# Patient Record
Sex: Male | Born: 1960 | Race: Black or African American | Hispanic: No | Marital: Married | Smoking: Former smoker
Health system: Southern US, Community
[De-identification: ages and names within clinical notes are randomized; demographics above are authoritative.]

## PROBLEM LIST (undated history)

## (undated) DIAGNOSIS — C801 Malignant (primary) neoplasm, unspecified: Secondary | ICD-10-CM

## (undated) DIAGNOSIS — I10 Essential (primary) hypertension: Secondary | ICD-10-CM

## (undated) DIAGNOSIS — F329 Major depressive disorder, single episode, unspecified: Secondary | ICD-10-CM

## (undated) DIAGNOSIS — I2510 Atherosclerotic heart disease of native coronary artery without angina pectoris: Secondary | ICD-10-CM

## (undated) DIAGNOSIS — E785 Hyperlipidemia, unspecified: Secondary | ICD-10-CM

## (undated) DIAGNOSIS — E119 Type 2 diabetes mellitus without complications: Secondary | ICD-10-CM

## (undated) DIAGNOSIS — F32A Depression, unspecified: Secondary | ICD-10-CM

## (undated) HISTORY — DX: Atherosclerotic heart disease of native coronary artery without angina pectoris: I25.10

## (undated) HISTORY — DX: Hyperlipidemia, unspecified: E78.5

## (undated) HISTORY — DX: Essential (primary) hypertension: I10

## (undated) HISTORY — PX: CARDIAC SURGERY: SHX584

## (undated) HISTORY — PX: CORONARY ANGIOPLASTY WITH STENT PLACEMENT: SHX49

---

## 1989-06-10 DIAGNOSIS — Z87898 Personal history of other specified conditions: Secondary | ICD-10-CM

## 2006-10-29 ENCOUNTER — Ambulatory Visit: Payer: Self-pay | Admitting: Cardiology

## 2006-10-29 ENCOUNTER — Inpatient Hospital Stay (HOSPITAL_COMMUNITY): Admission: EM | Admit: 2006-10-29 | Discharge: 2006-10-30 | Payer: Self-pay | Admitting: Emergency Medicine

## 2006-10-31 ENCOUNTER — Ambulatory Visit: Payer: Self-pay

## 2006-12-17 ENCOUNTER — Ambulatory Visit: Payer: Self-pay | Admitting: Cardiology

## 2007-03-19 ENCOUNTER — Ambulatory Visit: Payer: Self-pay | Admitting: Cardiology

## 2007-06-04 ENCOUNTER — Emergency Department (HOSPITAL_COMMUNITY): Admission: EM | Admit: 2007-06-04 | Discharge: 2007-06-04 | Payer: Self-pay | Admitting: Emergency Medicine

## 2007-07-09 ENCOUNTER — Ambulatory Visit: Payer: Self-pay | Admitting: Cardiology

## 2008-03-16 ENCOUNTER — Ambulatory Visit: Payer: Self-pay | Admitting: Cardiology

## 2008-03-28 ENCOUNTER — Ambulatory Visit: Payer: Self-pay | Admitting: Cardiology

## 2008-05-17 ENCOUNTER — Ambulatory Visit: Payer: Self-pay | Admitting: Family Medicine

## 2008-05-17 DIAGNOSIS — I10 Essential (primary) hypertension: Secondary | ICD-10-CM | POA: Insufficient documentation

## 2008-05-17 DIAGNOSIS — B9789 Other viral agents as the cause of diseases classified elsewhere: Secondary | ICD-10-CM

## 2008-05-17 DIAGNOSIS — E785 Hyperlipidemia, unspecified: Secondary | ICD-10-CM

## 2008-06-22 ENCOUNTER — Ambulatory Visit: Payer: Self-pay | Admitting: *Deleted

## 2008-08-29 ENCOUNTER — Ambulatory Visit: Payer: Self-pay | Admitting: Family Medicine

## 2008-08-29 DIAGNOSIS — K089 Disorder of teeth and supporting structures, unspecified: Secondary | ICD-10-CM | POA: Insufficient documentation

## 2008-08-29 DIAGNOSIS — R5381 Other malaise: Secondary | ICD-10-CM | POA: Insufficient documentation

## 2008-08-29 DIAGNOSIS — R5383 Other fatigue: Secondary | ICD-10-CM

## 2008-08-29 DIAGNOSIS — D179 Benign lipomatous neoplasm, unspecified: Secondary | ICD-10-CM | POA: Insufficient documentation

## 2008-08-29 DIAGNOSIS — Z8639 Personal history of other endocrine, nutritional and metabolic disease: Secondary | ICD-10-CM

## 2008-08-29 DIAGNOSIS — Z862 Personal history of diseases of the blood and blood-forming organs and certain disorders involving the immune mechanism: Secondary | ICD-10-CM

## 2008-08-30 ENCOUNTER — Encounter (INDEPENDENT_AMBULATORY_CARE_PROVIDER_SITE_OTHER): Payer: Self-pay | Admitting: Family Medicine

## 2008-08-31 ENCOUNTER — Encounter: Payer: Self-pay | Admitting: Physician Assistant

## 2008-08-31 ENCOUNTER — Ambulatory Visit: Payer: Self-pay | Admitting: Internal Medicine

## 2008-08-31 DIAGNOSIS — R072 Precordial pain: Secondary | ICD-10-CM

## 2008-08-31 DIAGNOSIS — I2510 Atherosclerotic heart disease of native coronary artery without angina pectoris: Secondary | ICD-10-CM | POA: Insufficient documentation

## 2008-09-02 ENCOUNTER — Encounter (INDEPENDENT_AMBULATORY_CARE_PROVIDER_SITE_OTHER): Payer: Self-pay | Admitting: Family Medicine

## 2008-09-02 LAB — CONVERTED CEMR LAB
AST: 271 units/L — ABNORMAL HIGH (ref 0–37)
Albumin: 4.3 g/dL (ref 3.5–5.2)
Alkaline Phosphatase: 91 units/L (ref 39–117)
BUN: 19 mg/dL (ref 6–23)
Basophils Absolute: 0.1 10*3/uL (ref 0.0–0.1)
HDL: 33 mg/dL — ABNORMAL LOW (ref >39)
Lymphocytes Relative: 45 % (ref 12–46)
Neutro Abs: 2.4 10*3/uL (ref 1.7–7.7)
Neutrophils Relative %: 40 % — ABNORMAL LOW (ref 43–77)
Platelets: 339 10*3/uL (ref 150–400)
Potassium: 4.8 meq/L (ref 3.5–5.3)
RDW: 14.6 % (ref 11.5–15.5)
Sodium: 145 meq/L (ref 135–145)
TSH: 1.385 microintl units/mL (ref 0.350–4.500)
Total Protein: 7.6 g/dL (ref 6.0–8.3)

## 2008-09-09 ENCOUNTER — Ambulatory Visit (HOSPITAL_COMMUNITY): Admission: RE | Admit: 2008-09-09 | Discharge: 2008-09-09 | Payer: Self-pay | Admitting: Family Medicine

## 2008-09-12 ENCOUNTER — Telehealth (INDEPENDENT_AMBULATORY_CARE_PROVIDER_SITE_OTHER): Payer: Self-pay | Admitting: *Deleted

## 2008-09-12 ENCOUNTER — Ambulatory Visit: Payer: Self-pay | Admitting: Family Medicine

## 2008-09-14 ENCOUNTER — Telehealth (INDEPENDENT_AMBULATORY_CARE_PROVIDER_SITE_OTHER): Payer: Self-pay | Admitting: *Deleted

## 2008-09-15 ENCOUNTER — Ambulatory Visit: Payer: Self-pay

## 2008-09-15 ENCOUNTER — Encounter: Payer: Self-pay | Admitting: Cardiology

## 2008-09-16 ENCOUNTER — Encounter: Payer: Self-pay | Admitting: Cardiology

## 2008-09-16 ENCOUNTER — Ambulatory Visit: Payer: Self-pay | Admitting: Cardiology

## 2008-09-16 DIAGNOSIS — I428 Other cardiomyopathies: Secondary | ICD-10-CM

## 2008-09-16 DIAGNOSIS — Z9119 Patient's noncompliance with other medical treatment and regimen: Secondary | ICD-10-CM

## 2008-09-16 DIAGNOSIS — M109 Gout, unspecified: Secondary | ICD-10-CM

## 2008-09-17 ENCOUNTER — Encounter (INDEPENDENT_AMBULATORY_CARE_PROVIDER_SITE_OTHER): Payer: Self-pay | Admitting: Family Medicine

## 2008-09-19 ENCOUNTER — Ambulatory Visit: Payer: Self-pay | Admitting: Family Medicine

## 2008-09-19 DIAGNOSIS — M79609 Pain in unspecified limb: Secondary | ICD-10-CM

## 2008-09-23 ENCOUNTER — Ambulatory Visit (HOSPITAL_COMMUNITY): Admission: RE | Admit: 2008-09-23 | Discharge: 2008-09-23 | Payer: Self-pay | Admitting: Family Medicine

## 2008-09-23 LAB — CONVERTED CEMR LAB
ALT: 191 units/L — ABNORMAL HIGH (ref 0–53)
Albumin: 4.4 g/dL (ref 3.5–5.2)
Bilirubin, Direct: 0.1 mg/dL (ref 0.0–0.3)
Cholesterol: 275 mg/dL — ABNORMAL HIGH (ref 0–200)
HDL: 40 mg/dL (ref >39)
Hep B S Ab: NEGATIVE
Total CHOL/HDL Ratio: 6.9
Total Protein: 7.3 g/dL (ref 6.0–8.3)
VLDL: 58 mg/dL — ABNORMAL HIGH (ref 0–40)

## 2008-10-11 ENCOUNTER — Telehealth (INDEPENDENT_AMBULATORY_CARE_PROVIDER_SITE_OTHER): Payer: Self-pay | Admitting: Family Medicine

## 2008-10-11 ENCOUNTER — Encounter (INDEPENDENT_AMBULATORY_CARE_PROVIDER_SITE_OTHER): Payer: Self-pay | Admitting: Family Medicine

## 2008-10-20 ENCOUNTER — Encounter (INDEPENDENT_AMBULATORY_CARE_PROVIDER_SITE_OTHER): Payer: Self-pay | Admitting: Family Medicine

## 2008-11-14 ENCOUNTER — Ambulatory Visit: Payer: Self-pay | Admitting: Nurse Practitioner

## 2008-11-14 ENCOUNTER — Encounter (INDEPENDENT_AMBULATORY_CARE_PROVIDER_SITE_OTHER): Payer: Self-pay | Admitting: Family Medicine

## 2008-11-15 DIAGNOSIS — K7689 Other specified diseases of liver: Secondary | ICD-10-CM

## 2008-11-15 LAB — CONVERTED CEMR LAB
ALT: 135 units/L — ABNORMAL HIGH (ref 0–53)
AST: 107 units/L — ABNORMAL HIGH (ref 0–37)
Alkaline Phosphatase: 72 units/L (ref 39–117)
Cholesterol: 273 mg/dL — ABNORMAL HIGH (ref 0–200)
Indirect Bilirubin: 0.3 mg/dL (ref 0.0–0.9)
Total Protein: 7.5 g/dL (ref 6.0–8.3)
Triglycerides: 216 mg/dL — ABNORMAL HIGH (ref <150)
VLDL: 43 mg/dL — ABNORMAL HIGH (ref 0–40)

## 2008-11-18 ENCOUNTER — Encounter (INDEPENDENT_AMBULATORY_CARE_PROVIDER_SITE_OTHER): Payer: Self-pay | Admitting: *Deleted

## 2008-11-21 ENCOUNTER — Telehealth (INDEPENDENT_AMBULATORY_CARE_PROVIDER_SITE_OTHER): Payer: Self-pay | Admitting: Nurse Practitioner

## 2008-11-24 ENCOUNTER — Encounter (INDEPENDENT_AMBULATORY_CARE_PROVIDER_SITE_OTHER): Payer: Self-pay | Admitting: Nurse Practitioner

## 2009-01-25 ENCOUNTER — Encounter (INDEPENDENT_AMBULATORY_CARE_PROVIDER_SITE_OTHER): Payer: Self-pay | Admitting: *Deleted

## 2009-02-06 ENCOUNTER — Encounter (INDEPENDENT_AMBULATORY_CARE_PROVIDER_SITE_OTHER): Payer: Self-pay | Admitting: Family Medicine

## 2009-02-10 ENCOUNTER — Encounter: Payer: Self-pay | Admitting: Physician Assistant

## 2009-02-16 ENCOUNTER — Telehealth (INDEPENDENT_AMBULATORY_CARE_PROVIDER_SITE_OTHER): Payer: Self-pay | Admitting: Nurse Practitioner

## 2009-02-24 ENCOUNTER — Ambulatory Visit: Payer: Self-pay | Admitting: Physician Assistant

## 2009-02-24 DIAGNOSIS — I2589 Other forms of chronic ischemic heart disease: Secondary | ICD-10-CM | POA: Insufficient documentation

## 2009-02-24 DIAGNOSIS — M5430 Sciatica, unspecified side: Secondary | ICD-10-CM

## 2009-02-24 DIAGNOSIS — M779 Enthesopathy, unspecified: Secondary | ICD-10-CM | POA: Insufficient documentation

## 2009-03-03 LAB — CONVERTED CEMR LAB
AST: 62 units/L — ABNORMAL HIGH (ref 0–37)
Alkaline Phosphatase: 73 units/L (ref 39–117)
BUN: 17 mg/dL (ref 6–23)
Creatinine, Ser: 1.06 mg/dL (ref 0.40–1.50)
Glucose, Bld: 97 mg/dL (ref 70–99)
Total Bilirubin: 0.4 mg/dL (ref 0.3–1.2)

## 2009-04-06 ENCOUNTER — Emergency Department (HOSPITAL_COMMUNITY): Admission: EM | Admit: 2009-04-06 | Discharge: 2009-04-06 | Payer: Self-pay | Admitting: Emergency Medicine

## 2009-04-07 ENCOUNTER — Encounter (INDEPENDENT_AMBULATORY_CARE_PROVIDER_SITE_OTHER): Payer: Self-pay | Admitting: *Deleted

## 2009-04-12 ENCOUNTER — Telehealth: Payer: Self-pay | Admitting: Physician Assistant

## 2009-04-12 ENCOUNTER — Ambulatory Visit: Payer: Self-pay | Admitting: Physician Assistant

## 2009-04-12 DIAGNOSIS — K602 Anal fissure, unspecified: Secondary | ICD-10-CM

## 2009-04-18 LAB — CONVERTED CEMR LAB
ALT: 127 units/L — ABNORMAL HIGH (ref 0–53)
Albumin: 4.5 g/dL (ref 3.5–5.2)
BUN: 17 mg/dL (ref 6–23)
CO2: 29 meq/L (ref 19–32)
Calcium: 10.2 mg/dL (ref 8.4–10.5)
Chloride: 103 meq/L (ref 96–112)
Creatinine, Ser: 1.08 mg/dL (ref 0.40–1.50)
Potassium: 5 meq/L (ref 3.5–5.3)

## 2009-04-24 ENCOUNTER — Ambulatory Visit (HOSPITAL_COMMUNITY): Admission: RE | Admit: 2009-04-24 | Discharge: 2009-04-24 | Payer: Self-pay | Admitting: Physician Assistant

## 2009-04-28 ENCOUNTER — Encounter: Payer: Self-pay | Admitting: Cardiology

## 2009-05-01 ENCOUNTER — Ambulatory Visit: Payer: Self-pay | Admitting: Internal Medicine

## 2009-05-01 ENCOUNTER — Encounter: Payer: Self-pay | Admitting: Physician Assistant

## 2009-05-01 LAB — CONVERTED CEMR LAB
CO2: 32 meq/L (ref 19–32)
Calcium: 10.2 mg/dL (ref 8.4–10.5)
Chloride: 99 meq/L (ref 96–112)
Creatinine, Ser: 1 mg/dL (ref 0.4–1.5)
Pro B Natriuretic peptide (BNP): 9 pg/mL (ref 0.0–100.0)
Sodium: 139 meq/L (ref 135–145)

## 2009-05-09 ENCOUNTER — Encounter: Payer: Self-pay | Admitting: Physician Assistant

## 2009-06-30 ENCOUNTER — Ambulatory Visit: Payer: Self-pay | Admitting: Cardiology

## 2009-09-12 ENCOUNTER — Telehealth (INDEPENDENT_AMBULATORY_CARE_PROVIDER_SITE_OTHER): Payer: Self-pay | Admitting: *Deleted

## 2009-09-14 ENCOUNTER — Encounter: Payer: Self-pay | Admitting: Physician Assistant

## 2009-10-10 ENCOUNTER — Encounter: Payer: Self-pay | Admitting: Physician Assistant

## 2009-10-31 ENCOUNTER — Telehealth: Payer: Self-pay | Admitting: Physician Assistant

## 2009-12-21 ENCOUNTER — Telehealth (INDEPENDENT_AMBULATORY_CARE_PROVIDER_SITE_OTHER): Payer: Self-pay | Admitting: Nurse Practitioner

## 2010-01-10 ENCOUNTER — Encounter: Payer: Self-pay | Admitting: Physician Assistant

## 2010-01-11 ENCOUNTER — Ambulatory Visit: Payer: Self-pay | Admitting: Physician Assistant

## 2010-01-11 ENCOUNTER — Telehealth (INDEPENDENT_AMBULATORY_CARE_PROVIDER_SITE_OTHER): Payer: Self-pay | Admitting: *Deleted

## 2010-01-11 DIAGNOSIS — L83 Acanthosis nigricans: Secondary | ICD-10-CM | POA: Insufficient documentation

## 2010-01-11 DIAGNOSIS — K625 Hemorrhage of anus and rectum: Secondary | ICD-10-CM | POA: Insufficient documentation

## 2010-01-11 LAB — CONVERTED CEMR LAB
Basophils Absolute: 0 10*3/uL
Basophils Relative: 1 %
Bilirubin Urine: NEGATIVE
Casts: NONE SEEN /LPF
Chlamydia, Swab/Urine, PCR: NEGATIVE
Cholesterol: 296 mg/dL — ABNORMAL HIGH
Crystals: NONE SEEN
Eosinophils Absolute: 0.1 10*3/uL
Eosinophils Relative: 2 %
GC Probe Amp, Urine: NEGATIVE
Glucose, Urine, Semiquant: NEGATIVE
HCT: 44.2 %
HDL: 43 mg/dL
Hemoglobin: 14.2 g/dL
Ketones, urine, test strip: NEGATIVE
LDL Cholesterol: 193 mg/dL — ABNORMAL HIGH
Lymphocytes Relative: 49 % — ABNORMAL HIGH
Lymphs Abs: 2.6 10*3/uL
MCHC: 32.1 g/dL
MCV: 92.5 fL
Monocytes Absolute: 0.6 10*3/uL
Monocytes Relative: 11 %
Neutro Abs: 2 10*3/uL
Neutrophils Relative %: 38 % — ABNORMAL LOW
Nitrite: NEGATIVE
PSA: 0.31 ng/mL
Platelets: 257 10*3/uL
Protein, U semiquant: NEGATIVE
RBC: 4.78 M/uL
RDW: 15.3 %
Rapid HIV Screen: NEGATIVE
Specific Gravity, Urine: >=1.03
Squamous Epithelial / HPF: NONE SEEN /LPF
TSH: 1.748 u[IU]/mL
Total CHOL/HDL Ratio: 6.9
Triglycerides: 298 mg/dL — ABNORMAL HIGH
Urobilinogen, UA: 0.2
VLDL: 60 mg/dL — ABNORMAL HIGH
WBC: 5.4 10*3/uL
pH: 5.5

## 2010-01-16 ENCOUNTER — Telehealth: Payer: Self-pay | Admitting: Physician Assistant

## 2010-01-16 DIAGNOSIS — N390 Urinary tract infection, site not specified: Secondary | ICD-10-CM

## 2010-01-19 ENCOUNTER — Ambulatory Visit: Payer: Self-pay | Admitting: Cardiology

## 2010-02-02 ENCOUNTER — Telehealth (INDEPENDENT_AMBULATORY_CARE_PROVIDER_SITE_OTHER): Payer: Self-pay | Admitting: *Deleted

## 2010-02-14 ENCOUNTER — Encounter (INDEPENDENT_AMBULATORY_CARE_PROVIDER_SITE_OTHER): Payer: Self-pay | Admitting: Internal Medicine

## 2010-02-16 ENCOUNTER — Telehealth: Payer: Self-pay | Admitting: Physician Assistant

## 2010-02-20 ENCOUNTER — Encounter: Payer: Self-pay | Admitting: Physician Assistant

## 2010-03-26 ENCOUNTER — Telehealth: Payer: Self-pay | Admitting: Physician Assistant

## 2010-03-29 ENCOUNTER — Ambulatory Visit: Payer: Self-pay | Admitting: Physician Assistant

## 2010-03-29 LAB — CONVERTED CEMR LAB
Glucose, Urine, Semiquant: NEGATIVE
Nitrite: NEGATIVE
Specific Gravity, Urine: 1.025
WBC Urine, dipstick: NEGATIVE

## 2010-03-30 ENCOUNTER — Encounter (INDEPENDENT_AMBULATORY_CARE_PROVIDER_SITE_OTHER): Payer: Self-pay | Admitting: Internal Medicine

## 2010-04-02 ENCOUNTER — Telehealth (INDEPENDENT_AMBULATORY_CARE_PROVIDER_SITE_OTHER): Payer: Self-pay | Admitting: Nurse Practitioner

## 2010-07-11 NOTE — Letter (Signed)
Summary: FAXED RECORDS TO [facility]  FAXED RECORDS TO [place] [facility] [facility]   Imported By: Arta Bruce 10/10/2009 15:39:44  _____________________________________________________________________  External Attachment:    Type:   Image     Comment:   External Document

## 2010-07-11 NOTE — Assessment & Plan Note (Signed)
Summary: 6 month rov 401.1 428.22  pfh,rn  Medications Added LEXAPRO 10 MG TABS (ESCITALOPRAM OXALATE) 3 by mouth daily CRESTOR 20 MG TABS (ROSUVASTATIN CALCIUM) OUT CIPRO 500 MG TABS (CIPROFLOXACIN HCL) 1 by mouth two times a day X 7 days      Allergies Added:   Visit Type:  Follow-up Primary Irini Leet:  Tereso Newcomer, PA-C  CC:  CAD.  History of Present Illness: The she presents for followup of coronary disease and a mildly reduced ejection fraction. Since I last saw him he did lose 12 pounds by walking. He denies any chest discomfort, neck or arm discomfort. He is not having any shortness of breath, PND or orthopnea. He is not having any palpitations, presyncope or syncope. He was out of his Crestor recently but was given a prescription by his primary Shina Wass. However, today he was saying he still had no Crestor until he remembered he had a prescription which she obviously hasn't filled. There is still obviously some disconnect but I think he is better than previous.  Current Medications (verified): 1)  Lexapro 10 Mg Tabs (Escitalopram Oxalate) .... 3 By Mouth Daily 2)  Plavix 75 Mg Tabs (Clopidogrel Bisulfate) .... Take One By Mouth Daily 3)  Ecotrin 325 Mg Tbec (Aspirin) .... Take One Tablet By Mouth Daily 4)  Lisinopril-Hydrochlorothiazide 20-25 Mg Tabs (Lisinopril-Hydrochlorothiazide) .... One Daily 5)  Crestor 20 Mg Tabs (Rosuvastatin Calcium) .... Out 6)  Clonidine Hcl 0.1 Mg Tabs (Clonidine Hcl) .... One Twice A Day 7)  Metoprolol Tartrate 100 Mg Tabs (Metoprolol Tartrate) .... One Twice Daily 8)  Nitrostat 0.4 Mg Subl (Nitroglycerin) .... As Needed 9)  Mirtazapine 15 Mg Tabs (Mirtazapine) .... 1/2 By Mouth At Bedtime 10)  Hydroxyzine Pamoate 25 Mg Caps (Hydroxyzine Pamoate) .... Take 1 Capsule By Mouth Three Times A Day As Needed 11)  Triamcinolone Acetonide 0.1 % Crea (Triamcinolone Acetonide) .... Apply To Area On Neck Two Times A Day As Needed (Do Not Use More Than 2 Weeks  Straight) 12)  Cipro 500 Mg Tabs (Ciprofloxacin Hcl) .Marland Kitchen.. 1 By Mouth Two Times A Day X 7 Days  Allergies (verified): 1)  ! Penicillin G Sodium (Penicillin G Sodium) 2)  ! Codeine Sulfate (Codeine Sulfate)  Past History:  Past Medical History: Reviewed history from 06/30/2009 and no changes required. Hypertension Coronary artery disease (status myocardial infarction with stents placed in [place] catheterization 2006 demonstrated circumflex obtuse marginal 50% stenosis.  This is managed medically.  The right coronary artery had a patent stent.  There was an ostial 80% PDA stenosis which was managed medically.) Stress Perfusion test 09/15/2008.Marland KitchenMarland KitchenEF 42% with apical thinning but no ischemia or infarct...Dr.Hochrein. 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  Past Surgical History: Reviewed history from 06/30/2009 and no changes required. Cardiac stent  Review of Systems       As stated in the HPI and negative for all other systems.   Vital Signs:  Patient profile:   50 year old male Height:      73 inches Weight:      280 pounds BMI:     37.08 Pulse rate:   68 / minute Resp:     18 per minute BP sitting:   121 / 79  (right arm)  Vitals Entered By: Marrion Coy, CNA (January 19, 2010 9:13 AM)  Physical Exam  General:  Well developed, well nourished, in no acute distress. Head:  normocephalic and atraumatic Eyes:  PERRLA/EOM  intact; conjunctiva and lids normal. Neck:  Neck supple, no JVD. No masses, thyromegaly or abnormal cervical nodes. Chest Wall:  no deformities or breast masses noted Lungs:  Clear bilaterally to auscultation and percussion. Abdomen:  Bowel sounds positive; abdomen soft and non-tender without masses, organomegaly, or hernias noted. No hepatosplenomegaly. Msk:  Back normal, normal gait. Muscle strength and tone normal. Extremities:  No clubbing or cyanosis. Neurologic:  Alert and oriented x 3. Skin:   Intact without lesions or rashes. Psych:  Normal affect.   Detailed Cardiovascular Exam  Neck    Carotids: Carotids full and equal bilaterally without bruits.      Neck Veins: Normal, no JVD.    Heart    Inspection: no deformities or lifts noted.      Palpation: normal PMI with no thrills palpable.      Auscultation: regular rate and rhythm, S1, S2 without murmurs, rubs, gallops, or clicks.    Vascular    Abdominal Aorta: no palpable masses, pulsations, or audible bruits.      Femoral Pulses: normal femoral pulses bilaterally.      Pedal Pulses: normal pedal pulses bilaterally.      Radial Pulses: normal radial pulses bilaterally.      Peripheral Circulation: no clubbing, cyanosis, or edema noted with normal capillary refill.     EKG  Procedure date:  01/19/2010  Findings:      Sinus rhythm, rate 68, axis within normal limits, intervals within normal limits, early repolarization pattern, no change from previous  Impression & Recommendations:  Problem # 1:  CARDIOMYOPATHY, ISCHEMIC (ICD-414.8) He is euvolemic and on a reasonable medical regimen. I think he is compliant. He will continue with the meds as listed.  Problem # 2:  CAD, NATIVE VESSEL (ICD-414.01) He has no new symptoms. He had a stress test in 2010. He will continue with risk reduction. Orders: EKG w/ Interpretation (93000)  Problem # 3:  HYPERTENSION (ICD-401.9) His blood pressure is controlled. He will continue the meds as listed.  Problem # 4:  HYPERLIPIDEMIA (ICD-272.4) Please refer to the recent note from Mr. Alben Spittle.  Patient Instructions: 1)  Your physician recommends that you schedule a follow-up appointment in: 1 year with Dr Antoine Poche 2)  Your physician recommends that you continue on your current medications as directed. Please refer to the Current Medication list given to you today.

## 2010-07-11 NOTE — Progress Notes (Signed)
Summary: Refills x2   Phone Note Outgoing Call   Summary of Call: Rec'd refill request for meds. . . Clonidine, Lisinopril/HCTZ, Metoprolol, Flexeril None of his BP meds have been filled since 3.7.2011. Dr. Antoine Poche gave him rx's for all of his meds in Jan. x 90 days with 3 refills. Is he taking them? Will fill the Lisinopril/HCTZ and Metoprolol since he needs these more than anything for his heart. Come in to lab in one week for BP check and BMET. Keep f/u with me in 2 weeks.  If he states that he is taking clonidine but getting from different pharmacy, have one of the other providers go ahead and refill clonidine too. Initial call taken by: Brynda Rim,  Oct 31, 2009 11:41 PM  Follow-up for Phone Call        spoke with pt and he said he is taking all his meds... pt does have appt on June 7 to do bloodwork that morning and meet with provider that evening..pt says that he does go to [facility] pharmacy and does need a refill on clonidine Follow-up by: Armenia Shannon,  Nov 01, 2009 10:00 AM  Additional Follow-up for Phone Call Additional follow up Details #1::        pt is aware... Armenia Shannon  Nov 03, 2009 3:31 PM     Prescriptions: CLONIDINE HCL 0.1 MG TABS (CLONIDINE HCL) one twice a day  #60 x 1   Entered and Authorized by:   Tereso Newcomer PA-C   Signed by:   Tereso Newcomer PA-C on 11/01/2009   Method used:   Faxed to ...       [facility] Department (retail)       [address] [place], [place]  [address]       Ph: [phone]       Fax: [phone]   RxID:   [id number] LISINOPRIL-HYDROCHLOROTHIAZIDE 20-25 MG TABS (LISINOPRIL-HYDROCHLOROTHIAZIDE) one daily  #30 x 1   Entered and Authorized by:   Tereso Newcomer PA-C   Signed by:   Tereso Newcomer PA-C on 10/31/2009   Method used:   Faxed to ...       [facility] Department (retail)       [address] [place], [place]  [address]       Ph: [phone]       Fax: [phone]   RxID:    [id number] METOPROLOL TARTRATE 100 MG TABS (METOPROLOL TARTRATE) one twice daily  #60 x 1   Entered and Authorized by:   Tereso Newcomer PA-C   Signed by:   Tereso Newcomer PA-C on 10/31/2009   Method used:   Faxed to ...       [facility] Department (retail)       [address] [place], [place]  [address]       Ph: [phone]       Fax: [phone]   RxID:   [id number]

## 2010-07-11 NOTE — Progress Notes (Signed)
Summary: GI referral   Phone Note Outgoing Call   Summary of Call: Please refer to Dr. Corinda Gubler for colo.  Has rectal bleeding. Debra Can you plase, help me with these  Thank you  Initial call taken by: Brynda Rim,  January 11, 2010 10:25 AM

## 2010-07-11 NOTE — Progress Notes (Signed)
Summary: Records request from [facility]  Request for records received from   [facility]. Request forwarded to [employer]. Dena Chavis  September 12, 2009 4:31 PM

## 2010-07-11 NOTE — Progress Notes (Signed)
Summary: Hold Plavix?  Phone Note Outgoing Call   Summary of Call: Leta Jungling, Patient needs colonoscopy.  They want to stop his plavix.  I don't see where he has had a PCI since 2006.  Not sure if he had DES or BMS.  Should be ok to hold plavix for his colonoscopy.  Any objections? Initial call taken by: Tereso Newcomer PA-C,  February 16, 2010 4:09 PM  Follow-up for Phone Call        OK to hold plavix as needed for the planned endoscopy. Follow-up by: Rollene Rotunda, MD, [facility],  February 18, 2010 5:06 PM

## 2010-07-11 NOTE — Progress Notes (Signed)
Summary: Urine results  Phone Note Call from Patient Call back at [phone]   Summary of Call: pt called about urine results...Marland Kitchen pt pharmacy [facility] pt says it is okay for me to leave message on [phone] Initial call taken by: Armenia Shannon,  April 02, 2010 9:23 AM  Follow-up for Phone Call        urine still has infection will treat with macrobid 100mg  by mouth two times a day x 7 days rx sent to [facility] pharmacy  advise pt to drink plenty of water and take all the meds Follow-up by: Lehman Prom FNP,  April 02, 2010 9:27 AM  Additional Follow-up for Phone Call Additional follow up Details #1::        PT AWARE VIA VM Additional Follow-up by: Armenia Shannon,  April 02, 2010 1:28 PM    New/Updated Medications: MACROBID 100 MG CAPS (NITROFURANTOIN MONOHYD MACRO) One tablet by mouth two times a day for infection Prescriptions: MACROBID 100 MG CAPS (NITROFURANTOIN MONOHYD MACRO) One tablet by mouth two times a day for infection  #14 x 0   Entered and Authorized by:   Lehman Prom FNP   Signed by:   Lehman Prom FNP on 04/02/2010   Method used:   Faxed to ...       [facility] (retail)       [address] [place], [place]  [address]       Ph: [phone]       Fax: [phone]   RxID:   [id number]

## 2010-07-11 NOTE — Letter (Signed)
Summary: MAILED REQUESTED RECORDS TO [facility]  MAILED REQUESTED RECORDS TO [facility]   Imported By: Arta Bruce 09/14/2009 10:42:26  _____________________________________________________________________  External Attachment:    Type:   Image     Comment:   External Document

## 2010-07-11 NOTE — Assessment & Plan Note (Signed)
Summary: CPE////KT   Vital Signs:  Patient profile:   50 year old male Height:      73 inches Weight:      275 pounds BMI:     36.41 Temp:     97.9 degrees F oral Pulse rate:   64 / minute Pulse rhythm:   regular Resp:     20 per minute BP sitting:   130 / 88  (left arm) Cuff size:   large  Vitals Entered By: Armenia Shannon (January 11, 2010 9:14 AM) CC: cpe Is Patient Diabetic? No Pain Assessment Patient in pain? no       Does patient need assistance? Functional Status Self care Ambulation Normal   Primary Care Provider:  Tereso Newcomer, PA-C  CC:  cpe.  History of Present Illness: Here for CPE. PHQ9=0.  Followed at [facility]. Wants PSA. NO Fhx of colon or prostate cancer. Seen for rectal bleeding several mos ago.  Has had other episodes.  Has h/o hard bm's.  Blood on tissue. Wants rash around his neck checked out.  Itchy.  Has been there a long time.  Current Medications (verified): 1)  Lexapro 20 Mg Tabs (Escitalopram Oxalate) .Marland Kitchen.. 1 As Needed 2)  Plavix 75 Mg Tabs (Clopidogrel Bisulfate) .... Take One By Mouth Daily 3)  Ecotrin 325 Mg Tbec (Aspirin) .... Take One Tablet By Mouth Daily 4)  Lisinopril-Hydrochlorothiazide 20-25 Mg Tabs (Lisinopril-Hydrochlorothiazide) .... One Daily 5)  Naprosyn 500 Mg Tabs (Naproxen) .... Out 6)  Flexeril 10 Mg Tabs (Cyclobenzaprine Hcl) .... Out 7)  Norvasc 5 Mg Tabs (Amlodipine Besylate) .... One Daily 8)  Crestor 20 Mg Tabs (Rosuvastatin Calcium) .... One Daily 9)  Proctosol Hc 2.5 % Crea (Hydrocortisone) .... Apply Two Times A Day To Three Times A Day As Needed (After A Bowel Movement) 10)  Clonidine Hcl 0.1 Mg Tabs (Clonidine Hcl) .... One Twice A Day 11)  One Daily Mens  Tabs (Multiple Vitamins-Minerals) .... One Tab Daily 12)  Abilify 2 Mg Tabs (Aripiprazole) .... Out 13)  Metoprolol Tartrate 100 Mg Tabs (Metoprolol Tartrate) .... One Twice Daily 14)  Nitrostat 0.4 Mg Subl (Nitroglycerin) .... As Needed  Allergies  (verified): 1)  ! Penicillin G Sodium (Penicillin G Sodium) 2)  ! Codeine Sulfate (Codeine Sulfate)  Family History: Significant for his mother with coronary artery disease   with MI in her late 8s or early 19s Aunts with breast cancer no prostate or colon cancer  Social History: Married  Tobacco Use - Former.  Alcohol Use - no Drug Use - yes   a.  denies 01/11/2010  Review of Systems      See HPI General:  Denies chills and fever. CV:  Denies chest pain or discomfort, fainting, and shortness of breath with exertion. Resp:  Denies cough. GI:  See HPI; Denies dark tarry stools. GU:  Denies hematuria. Derm:  See HPI. Psych:  Denies depression and suicidal thoughts/plans.  Physical Exam  General:  alert, well-developed, and well-nourished.   Head:  normocephalic and atraumatic.   Eyes:  pupils equal, pupils round, and pupils reactive to light.  fundi diff to visualize bilat Ears:  R ear normal and L ear normal.   Nose:  no external deformity.   Mouth:  pharynx pink and moist, no erythema, and no exudates.   Neck:  supple, no thyromegaly, no JVD, no carotid bruits, and no cervical lymphadenopathy.   Chest Wall:  no deformities.   Lungs:  normal breath sounds,  no crackles, and no wheezes.   Heart:  normal rate, regular rhythm, no murmur, and no gallop.   Abdomen:  soft, non-tender, normal bowel sounds, and no hepatomegaly.   Rectal:  normal sphincter tone, no masses, no tenderness, no fissures, no fistulae, no perianal rash, and external hemorrhoid(s).   Genitalia:  circumcised, no hydrocele, no varicocele, no scrotal masses, no testicular masses or atrophy, no cutaneous lesions, and no urethral discharge.   Prostate:  no gland enlargement, no nodules, no asymmetry, and no induration.   Msk:  normal ROM.   Pulses:  DP/PT 1+ bilat  Extremities:  no edema Neurologic:  alert & oriented X3, cranial nerves II-XII intact, and gait normal.   Skin:  acanthosis nigricans around  neck  Psych:  normally interactive and good eye contact.     Impression & Recommendations:  Problem # 1:  HYPERLIPIDEMIA (ICD-272.4)  no crestor "in a long time" I explained to him the importance of this today refill med check lipids . . . will likely be out of control rpt in 3 mos  His updated medication list for this problem includes:    Crestor 20 Mg Tabs (Rosuvastatin calcium) ..... One daily  Orders: T-Comprehensive Metabolic Panel [id number]) T-Lipid Profile ([id number])  Problem # 2:  RECTAL BLEEDING (ICD-569.3)  has had other episodes since last seen still sounds very suspicious for anal tear or hemorrhoids but, he is almost 50 and has a h/o cig abuse will refer to Dr. Doreatha Martin for colo.  Orders: Gastroenterology Referral (GI) T-CBC w/Diff ([id number]) T-Hemoccult Cards-Multiple (62952)  Problem # 3:  HYPERTENSION (ICD-401.9) Assessment: Improved  controlled on current meds   The following medications were removed from the medication list:    Norvasc 5 Mg Tabs (Amlodipine besylate) ..... One daily His updated medication list for this problem includes:    Lisinopril-hydrochlorothiazide 20-25 Mg Tabs (Lisinopril-hydrochlorothiazide) ..... One daily    Clonidine Hcl 0.1 Mg Tabs (Clonidine hcl) ..... One twice a day    Metoprolol Tartrate 100 Mg Tabs (Metoprolol tartrate) ..... One twice daily  Orders: T-Comprehensive Metabolic Panel [id number]) T-CBC w/Diff ([id number]) T-TSH [id number]) UA Dipstick w/o Micro (manual) (87564)  Problem # 4:  CARDIOMYOPATHY, ISCHEMIC (ICD-414.8) stable NYHA 2 f/u with Dr. Antoine Poche  The following medications were removed from the medication list:    Norvasc 5 Mg Tabs (Amlodipine besylate) ..... One daily His updated medication list for this problem includes:    Plavix 75 Mg Tabs (Clopidogrel bisulfate) .Marland Kitchen... Take one by mouth daily    Ecotrin 325 Mg Tbec (Aspirin) .Marland Kitchen... Take one tablet by mouth daily     Lisinopril-hydrochlorothiazide 20-25 Mg Tabs (Lisinopril-hydrochlorothiazide) ..... One daily    Clonidine Hcl 0.1 Mg Tabs (Clonidine hcl) ..... One twice a day    Metoprolol Tartrate 100 Mg Tabs (Metoprolol tartrate) ..... One twice daily    Nitrostat 0.4 Mg Subl (Nitroglycerin) .Marland Kitchen... As needed  Problem # 5:  CAD, NATIVE VESSEL (ICD-414.01) stable f/u with card  The following medications were removed from the medication list:    Norvasc 5 Mg Tabs (Amlodipine besylate) ..... One daily His updated medication list for this problem includes:    Plavix 75 Mg Tabs (Clopidogrel bisulfate) .Marland Kitchen... Take one by mouth daily    Ecotrin 325 Mg Tbec (Aspirin) .Marland Kitchen... Take one tablet by mouth daily    Lisinopril-hydrochlorothiazide 20-25 Mg Tabs (Lisinopril-hydrochlorothiazide) ..... One daily    Clonidine Hcl 0.1 Mg Tabs (Clonidine hcl) ..... One twice a day  Metoprolol Tartrate 100 Mg Tabs (Metoprolol tartrate) ..... One twice daily    Nitrostat 0.4 Mg Subl (Nitroglycerin) .Marland Kitchen... As needed  Orders: T-CBC w/Diff [id number])  Problem # 6:  EXAMINATION, ROUTINE MEDICAL (ICD-V70.0) wants PSA will also have him see Dr. 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 3 by mouth at bedtime 2)  Plavix 75 Mg Tabs (Clopidogrel bisulfate) .... Take one by mouth daily 3)  Ecotrin 325 Mg Tbec (Aspirin) .... Take one tablet by mouth daily 4)  Lisinopril-hydrochlorothiazide 20-25 Mg Tabs (Lisinopril-hydrochlorothiazide) .... One daily 5)  Naprosyn 500 Mg Tabs (Naproxen) .... Out 6)  Flexeril 10 Mg Tabs  (Cyclobenzaprine hcl) .... Out 7)  Crestor 20 Mg Tabs (Rosuvastatin calcium) .... One daily 8)  Clonidine Hcl 0.1 Mg Tabs (Clonidine hcl) .... One twice a day 9)  One Daily Mens Tabs (Multiple vitamins-minerals) .... One tab daily 10)  Metoprolol Tartrate 100 Mg Tabs (Metoprolol tartrate) .... One twice daily 11)  Nitrostat 0.4 Mg Subl (Nitroglycerin) .... As needed 12)  Mirtazapine 15 Mg Tabs (Mirtazapine) .... 1/2 by mouth at bedtime 13)  Hydroxyzine Pamoate 25 Mg Caps (Hydroxyzine pamoate) .... Take 1 capsule by mouth three times a day as needed 14)  Triamcinolone Acetonide 0.1 % Crea (Triamcinolone acetonide) .... Apply to area on neck two times a day as needed (do not use more than 2 weeks straight)  Other Orders: T-Culture, Urine ([id number]) T- * Misc. Laboratory test [phone])  Patient Instructions: 1)  Schedule fasting Lipids and LFTs in 3 months. 2)  Schedule follow up with Scott in 4 months for blood pressure. 3)  Someone will call you about setting up a colonoscopy. 4)  Use the cream two times a day as needed.   Do not use more than 2 weeks straight. Prescriptions: CRESTOR 20 MG TABS (ROSUVASTATIN CALCIUM) one daily  #90 x 31   Entered and Authorized by:   Tereso Newcomer PA-C   Signed by:   Tereso Newcomer PA-C on 01/11/2010   Method used:   Print then Give to Patient   RxID:   [id number] TRIAMCINOLONE ACETONIDE 0.1 % CREA (TRIAMCINOLONE ACETONIDE) apply to area on neck two times a day as needed (do not use more than 2 weeks straight)  #30 grams x 1   Entered and Authorized by:   Tereso Newcomer PA-C   Signed by:   Tereso Newcomer PA-C on 01/11/2010   Method used:   Print then Give to Patient   RxID:   [id number]   Laboratory Results   Urine Tests    Routine Urinalysis   Glucose: negative   (Normal Range: Negative) Bilirubin: negative   (Normal Range: Negative) Ketone: negative   (Normal Range: Negative) Spec. Gravity: >=1.030   (Normal Range:  1.003-1.035) Blood: trace-lysed   (Normal Range: Negative) pH: 5.5   (Normal Range: 5.0-8.0) Protein: negative   (Normal Range: Negative) Urobilinogen: 0.2   (Normal Range: 0-1) Nitrite: negative   (Normal Range: Negative) Leukocyte Esterace: trace   (Normal Range: Negative)      Other Tests  Rapid HIV: negative   Appended Document: CPE////KT Phone Note Outgoing Call  Summary of Call: 1.  Patient has UTI.  Needs to take Ciprofloxacin 500 mg Take 1 tablet by mouth two times a day x 7 days.  Please call in to his pharmacy (#14, no refills).  It is $4 at [facility].  2.  Repeat u/a in 3 weeks.  3.  GC/chlamydia checked.  I did not order this.  No results.  If patient requested he needs urine recollected.  Can do at f/u u/a.  4.  Chol out of control.  He just restarted meds.  Knew it would be this way.  Repeat FLP and LFTs in 3 mos.  5.  Thyroid test, blood counts and prostate test normal.  6.  CMET ordered but not done.  Needs this done.  Add to blood in lab or redraw. Initial call taken by: Tereso Newcomer PA-C,  January 16, 2010 9:50 AM

## 2010-07-11 NOTE — Progress Notes (Signed)
   Phone Note Outgoing Call   Summary of Call: 1.  Patient has UTI.  Needs to take Ciprofloxacin 500 mg Take 1 tablet by mouth two times a day x 7 days.  Please call in to his pharmacy (#14, no refills).  It is $4 at [facility].  2.  Repeat u/a in 3 weeks.  3.  GC/chlamydia checked.  I did not order this.  No results.  If patient requested he needs urine recollected.  Can do at f/u u/a.  4.  Chol out of control.  He just restarted meds.  Knew it would be this way.  Repeat FLP and LFTs in 3 mos.  5.  Thyroid test, blood counts and prostate test normal.  6.  CMET ordered but not done.  Needs this done.  Add to blood in lab or redraw. Initial call taken by: Tereso Newcomer PA-C,  January 16, 2010 9:50 AM  Follow-up for Phone Call        spoke with wife and she is aware.... [place] Shannon  January 16, 2010 10:10 AM   New Problems: UTI (ICD-599.0)   New Problems: UTI (ICD-599.0)    Impression & Recommendations:  Problem # 1:  UTI (ICD-599.0) >100K CFU Proteus  Rx with cipro x 7 days repeat u/a in 3 weeks  Problem # 2:  HYPERLIPIDEMIA (ICD-272.4) noncompliant with meds restarted crestor repeat FLPand LFTs in 3mos  His updated medication list for this problem includes:    Crestor 20 Mg Tabs (Rosuvastatin calcium) ..... One daily  Complete Medication List: 1)  Lexapro 20 Mg Tabs (Escitalopram oxalate) .... 3 by mouth at bedtime 2)  Plavix 75 Mg Tabs (Clopidogrel bisulfate) .... Take one by mouth daily 3)  Ecotrin 325 Mg Tbec (Aspirin) .... Take one tablet by mouth daily 4)  Lisinopril-hydrochlorothiazide 20-25 Mg Tabs (Lisinopril-hydrochlorothiazide) .... One daily 5)  Naprosyn 500 Mg Tabs (Naproxen) .... Out 6)  Flexeril 10 Mg Tabs (Cyclobenzaprine hcl) .... Out 7)  Crestor 20 Mg Tabs (Rosuvastatin calcium) .... One daily 8)  Clonidine Hcl 0.1 Mg Tabs (Clonidine hcl) .... One twice a day 9)  One Daily Mens Tabs (Multiple vitamins-minerals) .... One tab daily 10)  Metoprolol  Tartrate 100 Mg Tabs (Metoprolol tartrate) .... One twice daily 11)  Nitrostat 0.4 Mg Subl (Nitroglycerin) .... As needed 12)  Mirtazapine 15 Mg Tabs (Mirtazapine) .... 1/2 by mouth at bedtime 13)  Hydroxyzine Pamoate 25 Mg Caps (Hydroxyzine pamoate) .... Take 1 capsule by mouth three times a day as needed 14)  Triamcinolone Acetonide 0.1 % Crea (Triamcinolone acetonide) .... Apply to area on neck two times a day as needed (do not use more than 2 weeks straight)   Appended Document:      Phone Note Outgoing Call   Summary of Call: GC and chlamydia negative Initial call taken by: Brynda Rim,  January 16, 2010 8:42 PM  Follow-up for Phone Call        Left message on answering machine for pt. to return call.  Dutch Quint RN  January 17, 2010 10:26 AM   pt is aware... [place] Shannon  January 17, 2010 12:08 PM

## 2010-07-11 NOTE — Progress Notes (Signed)
Summary: Office Visit/DEPRESSION SCREENIG  Office Visit/DEPRESSION SCREENIG   Imported By: Arta Bruce 01/30/2010 15:17:51  _____________________________________________________________________  External Attachment:    Type:   Image     Comment:   External Document

## 2010-07-11 NOTE — Progress Notes (Signed)
Summary: Query:  Refill BP meds?  Phone Note Outgoing Call   Summary of Call: Last seen 01/2010.  Do you want to refill his clonidine, lisinopril, HCTZ and metoprolol?  Last refill date 10/2009 x1 refill.   Initial call taken by: Dutch Quint RN,  March 26, 2010 2:33 PM  Follow-up for Phone Call        Refill x 6 mos. He needs a repeat u/a . . .tx for UTI in Aug and was to have repeat u/a done. Also needs CMET and FLP in Nov (Crestor was restarted then).  Follow-up by: Tereso Newcomer PA-C,  March 27, 2010 9:38 AM  Additional Follow-up for Phone Call Additional follow up Details #1::        Rxs refilled -- repeat u/a scheduled 03/29/10 and labwork appt. 04/23/10.  Dutch Quint RN  March 28, 2010 2:55 PM     Prescriptions: LISINOPRIL-HYDROCHLOROTHIAZIDE 20-25 MG TABS (LISINOPRIL-HYDROCHLOROTHIAZIDE) one daily  #30 x 5   Entered by:   Dutch Quint RN   Authorized by:   Tereso Newcomer PA-C   Signed by:   Dutch Quint RN on 03/27/2010   Method used:   Historical   RxID:   [id number] METOPROLOL TARTRATE 100 MG TABS (METOPROLOL TARTRATE) one twice daily  #60 x 5   Entered by:   Dutch Quint RN   Authorized by:   Tereso Newcomer PA-C   Signed by:   Dutch Quint RN on 03/27/2010   Method used:   Historical   RxID:   [id number] CLONIDINE HCL 0.1 MG TABS (CLONIDINE HCL) one twice a day  #60 x 5   Entered by:   Dutch Quint RN   Authorized by:   Tereso Newcomer PA-C   Signed by:   Dutch Quint RN on 03/27/2010   Method used:   Historical   RxID:   [id number]

## 2010-07-11 NOTE — Progress Notes (Signed)
Summary: Need Referral info (ASAP)   Phone Note Call from Patient Call back at Home Phone [phone]   Summary of Call: Pt calling for location and telephone number for appt scheduled Monday, Sept 29th. Pt needs to be called back today because he needs to reschedule appt you may also talk to his wife Zyden Suman. Initial call taken by: Hassell Halim CMA,  February 02, 2010 12:45 PM  Follow-up for Phone Call        PT CALLED [facility]  Follow-up by: Cheryll Dessert,  February 02, 2010 3:40 PM

## 2010-07-11 NOTE — Letter (Signed)
Summary: [facility]  [facility]   Imported By: Arta Bruce 02/20/2010 09:32:31  _____________________________________________________________________  External Attachment:    Type:   Image     Comment:   External Document

## 2010-07-11 NOTE — Progress Notes (Signed)
Summary: NEED PLAVIX REFILL.   Phone Note Call from Patient Call back at Home Phone [phone]   Reason for Call: Refill Medication Summary of Call: Miguel PT. Miguel Bell IS NEEDING A REFILL ON HIS PLAVIX, AND HE ONLY HAS 4 PILLS LEFT. HE USES [facility]. Initial call taken by: Leodis Rains,  December 21, 2009 12:06 PM  Follow-up for Phone Call        plavix rx sent to pharmacy  notify pt Follow-up by: Lehman Prom FNP,  December 21, 2009 2:46 PM  Additional Follow-up for Phone Call Additional follow up Details #1::        His phone does not accept incoming calls.  Dutch Quint RN  December 21, 2009 3:44 PM  notify pt if he returns the calls n.[email or web address],fnp   December 21, 2009  3:58 PM     Prescriptions: PLAVIX 75 MG TABS (CLOPIDOGREL BISULFATE) take one by mouth daily  #30 x 5   Entered and Authorized by:   Lehman Prom FNP   Signed by:   Lehman Prom FNP on 12/21/2009   Method used:   Faxed to ...       [facility] (retail)       [address] [place], [place]  [address]       Ph: [phone]       Fax: [phone]   RxID:   [id number]

## 2010-07-11 NOTE — Assessment & Plan Note (Signed)
Summary: 2 mo f/u  Medications Added LEXAPRO 20 MG TABS (ESCITALOPRAM OXALATE) 1 as needed LISINOPRIL-HYDROCHLOROTHIAZIDE 20-25 MG TABS (LISINOPRIL-HYDROCHLOROTHIAZIDE) OUT LISINOPRIL-HYDROCHLOROTHIAZIDE 20-25 MG TABS (LISINOPRIL-HYDROCHLOROTHIAZIDE) one daily NAPROSYN 500 MG TABS (NAPROXEN) out FLEXERIL 10 MG TABS (CYCLOBENZAPRINE HCL) OUT NORVASC 5 MG TABS (AMLODIPINE BESYLATE) OUT NORVASC 5 MG TABS (AMLODIPINE BESYLATE) one daily CRESTOR 20 MG TABS (ROSUVASTATIN CALCIUM) one daily CRESTOR 20 MG TABS (ROSUVASTATIN CALCIUM) OUT CLONIDINE HCL 0.1 MG TABS (CLONIDINE HCL) OUT CLONIDINE HCL 0.1 MG TABS (CLONIDINE HCL) one twice a day ABILIFY 2 MG TABS (ARIPIPRAZOLE) OUT METOPROLOL TARTRATE 100 MG TABS (METOPROLOL TARTRATE) OUT METOPROLOL TARTRATE 100 MG TABS (METOPROLOL TARTRATE) one twice daily NITROSTAT 0.4 MG SUBL (NITROGLYCERIN) as needed      Allergies Added:   Visit Type:  Follow-up Primary Provider:  Tereso Newcomer, PA-C  CC:  CAD/cardiomyopathy.  History of Present Illness: The patient presents for followup. He has a mildly reduced ejection fraction and known coronary disease.  He had a stress perfusion study in April of last year when he complained of chest discomfort. This demonstrated an EF of 42% with no high risk findings. He was managed medically. Unfortunately he has come off of all of his medications. He says he doesn't have the finances though most of his prescriptions are $5. He has care at [facility]. Despite this he struggles with maintaining his medical regimen and has not had any for 2 weeks. He says he is able to get Plavix. He says he's not been feeling well. Feeling more short of breath. He's been feeling fatigued. He's had some episodes of dizziness. He describes some orthostatic symptoms. There have been no acute event such as syncope or presyncope. He's continued to have some atypical chest discomfort similar to his previous. This is unchanged from the time of  his most recent stress test.  Current Medications (verified): 1)  Lexapro 20 Mg Tabs (Escitalopram Oxalate) .Marland Kitchen.. 1 As Needed 2)  Plavix 75 Mg Tabs (Clopidogrel Bisulfate) .... Take One By Mouth Daily 3)  Ecotrin 325 Mg Tbec (Aspirin) .... Take One Tablet By Mouth Daily 4)  Lisinopril-Hydrochlorothiazide 20-25 Mg Tabs (Lisinopril-Hydrochlorothiazide) .... Out 5)  Naprosyn 500 Mg Tabs (Naproxen) .... Out 6)  Flexeril 10 Mg Tabs (Cyclobenzaprine Hcl) .... Out 7)  Norvasc 5 Mg Tabs (Amlodipine Besylate) .... Out 8)  Crestor 20 Mg Tabs (Rosuvastatin Calcium) .... Out 9)  Proctosol Hc 2.5 % Crea (Hydrocortisone) .... Apply Two Times A Day To Three Times A Day As Needed (After A Bowel Movement) 10)  Clonidine Hcl 0.1 Mg Tabs (Clonidine Hcl) .... Out 11)  One Daily Mens  Tabs (Multiple Vitamins-Minerals) .... One Tab Daily 12)  Abilify 2 Mg Tabs (Aripiprazole) .... Out 13)  Metoprolol Tartrate 100 Mg Tabs (Metoprolol Tartrate) .... Out 14)  Nitrostat 0.4 Mg Subl (Nitroglycerin) .... As Needed  Allergies (verified): 1)  ! Penicillin G Sodium (Penicillin G Sodium) 2)  ! Codeine Sulfate (Codeine Sulfate)  Past History:  Past Medical History: Hypertension Coronary artery disease (status myocardial infarction with stents placed in [place] catheterization 2006 demonstrated circumflex obtuse marginal 50% stenosis.  This is managed medically.  The right coronary artery had a patent stent.  There was an ostial 80% PDA stenosis which was managed medically.) Stress Perfusion test 09/15/2008.Marland KitchenMarland KitchenEF 42% with apical thinning but no ischemia or infarct...Dr.Anabelle Bungert. 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  Past Surgical History: Cardiac stent  Review of Systems  As stated in the HPI and negative for all other systems.   Vital Signs:  Patient profile:   50 year old male Height:      73 inches Weight:      288 pounds BMI:      38.13 Pulse rate:   86 / minute Resp:     18 per minute BP sitting:   188 / 115  (right arm)  Vitals Entered By: Marrion Coy, CNA (June 30, 2009 9:02 AM)  Physical Exam  General:  Well developed, well nourished, in no acute distress. Head:  normocephalic and atraumatic Eyes:  PERRLA/EOM intact; conjunctiva and lids normal. Mouth:  Teeth, gums and palate normal. Oral mucosa normal. Neck:  Neck supple, no JVD. No masses, thyromegaly or abnormal cervical nodes. Chest Wall:  no deformities or breast masses noted Lungs:  Clear bilaterally to auscultation and percussion. Heart:  Non-displaced PMI, chest non-tender; regular rate and rhythm, S1, S2 without murmurs, rubs or gallops. Carotid upstroke normal, no bruit. Normal abdominal aortic size, no bruits. Femorals normal pulses, no bruits. Pedals normal pulses. No edema, no varicosities. Abdomen:  Bowel sounds positive; abdomen soft and non-tender without masses, organomegaly, or hernias noted. No hepatosplenomegaly, obese Msk:  Back normal, normal gait. Muscle strength and tone normal. Extremities:  No clubbing or cyanosis. Neurologic:  Alert and oriented x 3. Skin:  Intact without lesions or rashes. Psych:  Normal affect.   EKG  Procedure date:  06/30/2009  Findings:      Sinus rhythm, rate 78, possible old inferior infarct, old anteroseptal infarct, repolarization changes evident on previous EKGs, inferior T-wave inversions evident on previous EKGs.  No acute findings.  Impression & Recommendations:  Problem # 1:  HYPERTENSION (ICD-401.9) The most significant issue is his medical noncompliance. He is given social assistance with enrollment in the [facility] and reduced cost prescriptions. We rewrote all of these prescriptions today and he says he can go get his blood pressure medicines today once he realized we had no samples of these. He understands his risk of worsening heart failure stroke I cardioversion or death  if he does not comply. He will have his blood pressure followed by his primary physician.  Problem # 2:  CARDIOMYOPATHY (ICD-425.4)  He has no evidence of volume overload. His last BNP was actually 9. His dyspnea may be related to his hypertension and this needs to be controlled as above.  His updated medication list for this problem includes:    Plavix 75 Mg Tabs (Clopidogrel bisulfate) .Marland Kitchen... Take one by mouth daily    Ecotrin 325 Mg Tbec (Aspirin) .Marland Kitchen... Take one tablet by mouth daily    Lisinopril-hydrochlorothiazide 20-25 Mg Tabs (Lisinopril-hydrochlorothiazide) ..... One daily    Norvasc 5 Mg Tabs (Amlodipine besylate) ..... One daily    Metoprolol Tartrate 100 Mg Tabs (Metoprolol tartrate) ..... One twice daily    Nitrostat 0.4 Mg Subl (Nitroglycerin) .Marland Kitchen... As needed  Problem # 3:  CHEST PAIN-PRECORDIAL (ICD-786.51) His chest pain is unchanged from previous at the time of his stress test at which point he had no high-risk ischemic areas. Based on the absence of change no further cardiovascular testing is suggested.  Other Orders: EKG w/ Interpretation (93000)  Patient Instructions: 1)  Your physician recommends that you schedule a follow-up appointment in: 6 months withDr Airyn Ellzey 2)  Your physician recommends that you continue on your current medications as directed. Please refer to the Current Medication list given to you today.  Refills  given today 3)  You have been diagnosed with Congestive Heart Failure or CHF.  CHF is a condition in which a problem with the structure or function of the heart impairs its ability to supply sufficient blood flow to meet the body's needs.  For further information please visit www.cardiosmart.org for detailed information on CHF. 4)  Your physician recommends that you weigh, daily, at the same time every day, and in the same amount of clothing.  Please record your daily weights on the handout provided and bring it to your next  appointment. Prescriptions: METOPROLOL TARTRATE 100 MG TABS (METOPROLOL TARTRATE) one twice daily  #180 x 3   Entered by:   Charolotte Capuchin, RN   Authorized by:   Rollene Rotunda, MD, [facility]   Signed by:   Charolotte Capuchin, RN on 06/30/2009   Method used:   Print then Give to Patient   RxID:   [id number] CRESTOR 20 MG TABS (ROSUVASTATIN CALCIUM) one daily  #90 x 3   Entered by:   Charolotte Capuchin, RN   Authorized by:   Rollene Rotunda, MD, [facility]   Signed by:   Charolotte Capuchin, RN on 06/30/2009   Method used:   Print then Give to Patient   RxID:   [id number] CLONIDINE HCL 0.1 MG TABS (CLONIDINE HCL) one twice a day  #180 x 3   Entered by:   Charolotte Capuchin, RN   Authorized by:   Rollene Rotunda, MD, [facility]   Signed by:   Charolotte Capuchin, RN on 06/30/2009   Method used:   Print then Give to Patient   RxID:   [id number] LISINOPRIL-HYDROCHLOROTHIAZIDE 20-25 MG TABS (LISINOPRIL-HYDROCHLOROTHIAZIDE) one daily  #90 x 3   Entered by:   Charolotte Capuchin, RN   Authorized by:   Rollene Rotunda, MD, [facility]   Signed by:   Charolotte Capuchin, RN on 06/30/2009   Method used:   Print then Give to Patient   RxID:   [id number] NORVASC 5 MG TABS (AMLODIPINE BESYLATE) one daily  #90 x 3   Entered by:   Charolotte Capuchin, RN   Authorized by:   Rollene Rotunda, MD, [facility]   Signed by:   Charolotte Capuchin, RN on 06/30/2009   Method used:   Print then Give to Patient   RxID:   [id number] CLONIDINE HCL 0.1 MG TABS (CLONIDINE HCL) OUT  #180 x 3   Entered by:   Charolotte Capuchin, RN   Authorized by:   Rollene Rotunda, MD, [facility]   Signed by:   Charolotte Capuchin, RN on 06/30/2009   Method used:   Print then Give to Patient   RxID:   [id number] NITROSTAT 0.4 MG SUBL (NITROGLYCERIN) as needed  #25 x 3   Entered by:   Charolotte Capuchin, RN   Authorized by:   Rollene Rotunda, MD, [facility]   Signed by:   Charolotte Capuchin, RN on 06/30/2009    Method used:   Print then Give to Patient   RxID:   [id number] METOPROLOL TARTRATE 100 MG TABS (METOPROLOL TARTRATE) OUT  #180 x 3   Entered by:   Charolotte Capuchin, RN   Authorized by:   Rollene Rotunda, MD, [facility]   Signed by:   Charolotte Capuchin, RN on 06/30/2009   Method used:   Print then Give to Patient   RxID:   [id number] CLONIDINE HCL 0.1 MG TABS (CLONIDINE HCL) OUT  #90 x 3   Entered by:   Charolotte Capuchin, RN   Authorized by:  Rollene Rotunda, MD, [facility]   Signed by:   Charolotte Capuchin, RN on 06/30/2009   Method used:   Print then Give to Patient   RxID:   [id number] CRESTOR 20 MG TABS (ROSUVASTATIN CALCIUM) OUT  #90 x 3   Entered by:   Charolotte Capuchin, RN   Authorized by:   Rollene Rotunda, MD, [facility]   Signed by:   Charolotte Capuchin, RN on 06/30/2009   Method used:   Print then Give to Patient   RxID:   [id number] NORVASC 5 MG TABS (AMLODIPINE BESYLATE) OUT  #90 x 3   Entered by:   Charolotte Capuchin, RN   Authorized by:   Rollene Rotunda, MD, [facility]   Signed by:   Charolotte Capuchin, RN on 06/30/2009   Method used:   Print then Give to Patient   RxID:   [id number] LISINOPRIL-HYDROCHLOROTHIAZIDE 20-25 MG TABS (LISINOPRIL-HYDROCHLOROTHIAZIDE) OUT  #90 x 3   Entered by:   Charolotte Capuchin, RN   Authorized by:   Rollene Rotunda, MD, [facility]   Signed by:   Charolotte Capuchin, RN on 06/30/2009   Method used:   Print then Give to Patient   RxID:   [id number]

## 2010-07-11 NOTE — Letter (Signed)
Summary: REFERRAL/PHYSICAL THERAPY  REFERRAL/PHYSICAL THERAPY   Imported By: Arta Bruce 07/18/2009 15:02:46  _____________________________________________________________________  External Attachment:    Type:   Image     Comment:   External Document

## 2010-08-16 NOTE — Consult Note (Signed)
Summary: Consultation Report  Consultation Report   Imported By: Arta Bruce 08/07/2010 12:30:23  _____________________________________________________________________  External Attachment:    Type:   Image     Comment:   External Document

## 2010-09-13 IMAGING — US US CHEST/MEDIASTINUM
1 series · 13 of 13 positions shown · non-contrast
Comparison: None.

CLINICAL DATA: Anterior left chest soft tissue mass with visible
characteristics of a lipoma.

CHEST ULTRASOUND 09/09/2008:

[Series 1: us chest/mediastinum · 0.08mm/px · 13 of 13 slices shown]
[im 1/13]
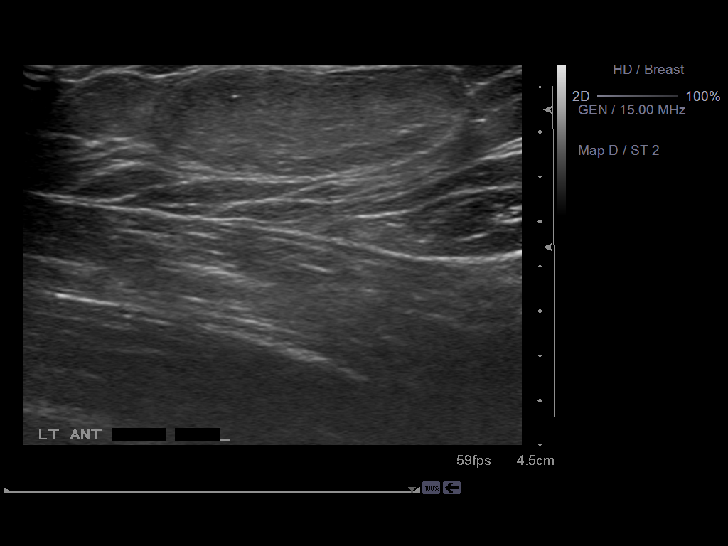
[im 2/13]
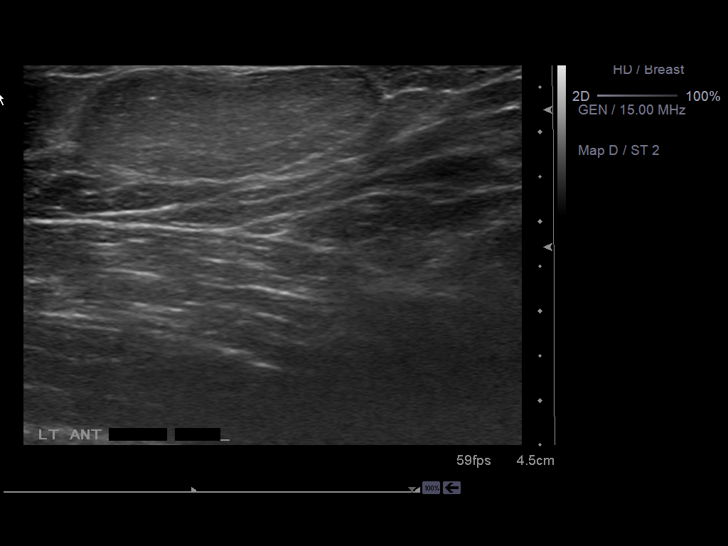
[im 3/13]
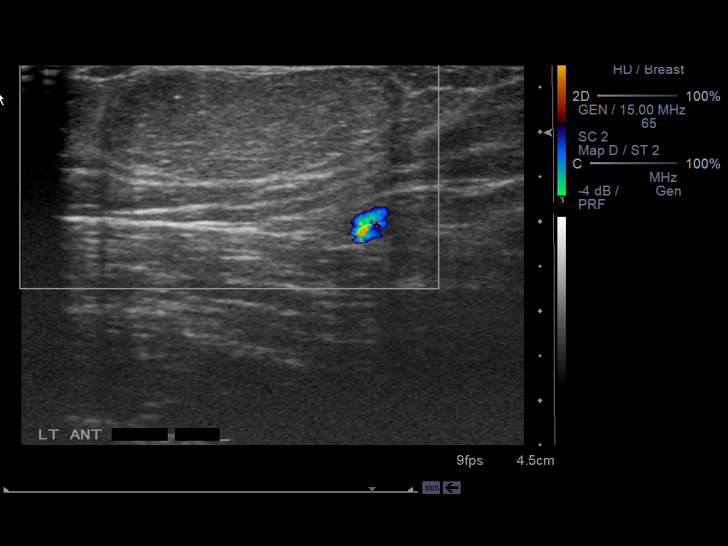
[im 4/13]
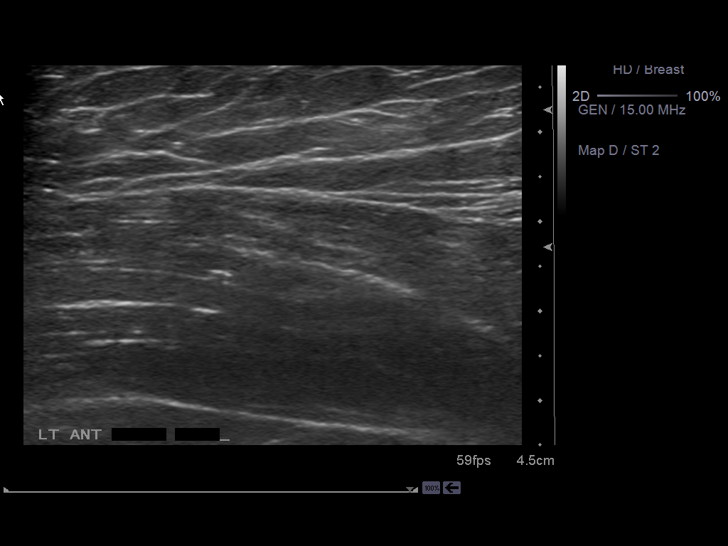
[im 5/13]
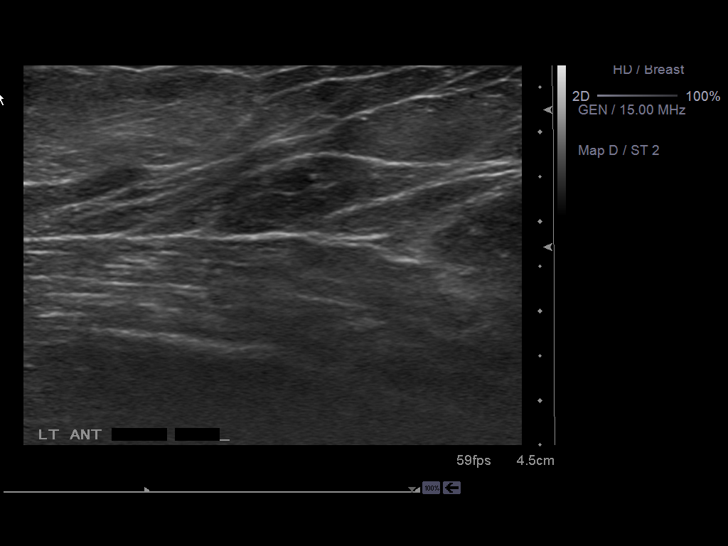
[im 6/13]
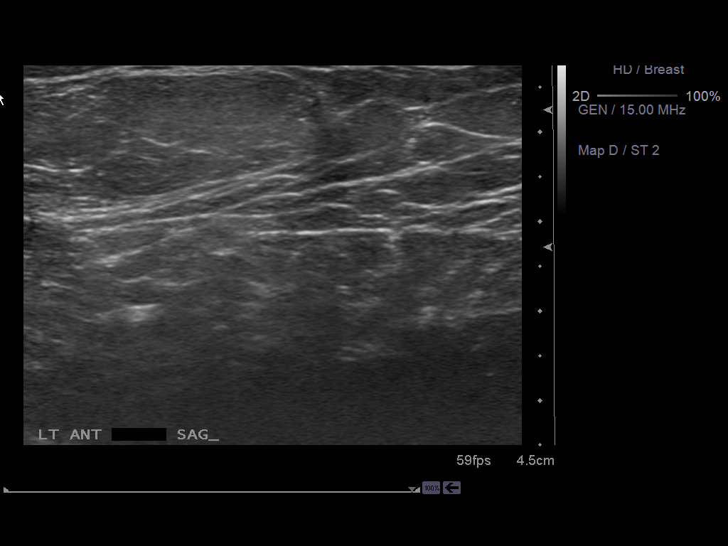
[im 7/13]
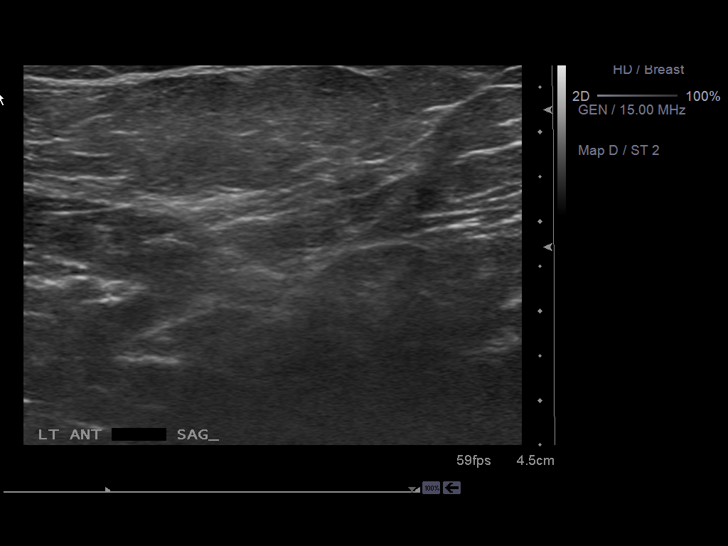
[im 8/13]
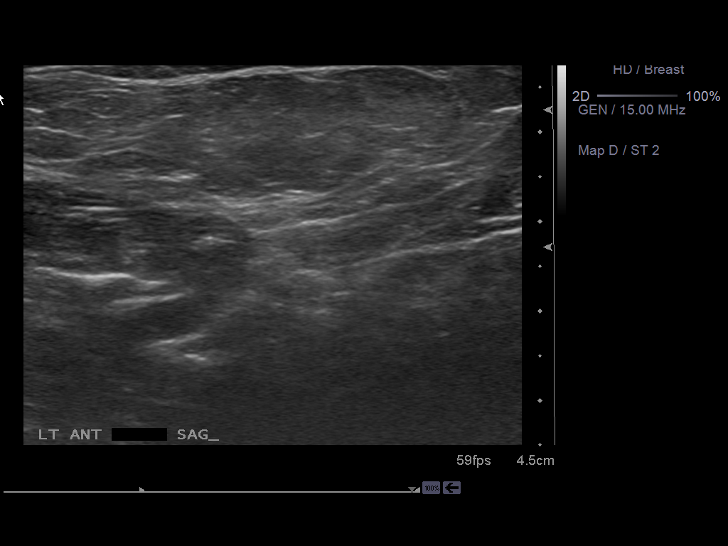
[im 9/13]
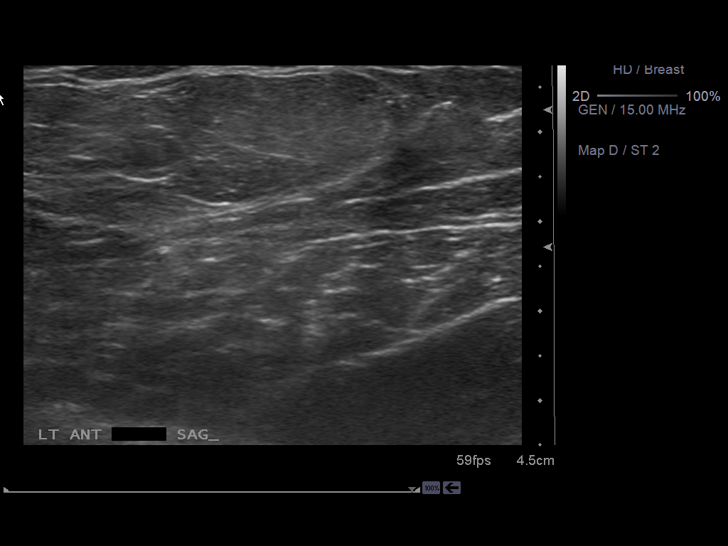
[im 10/13]
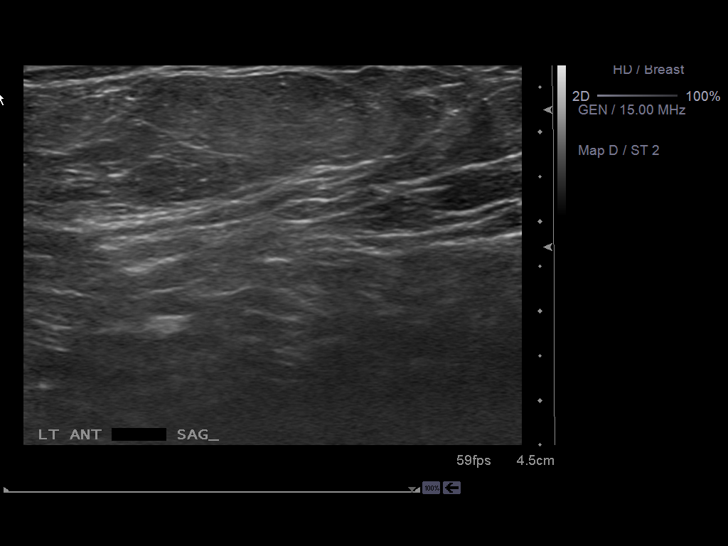
[im 11/13]
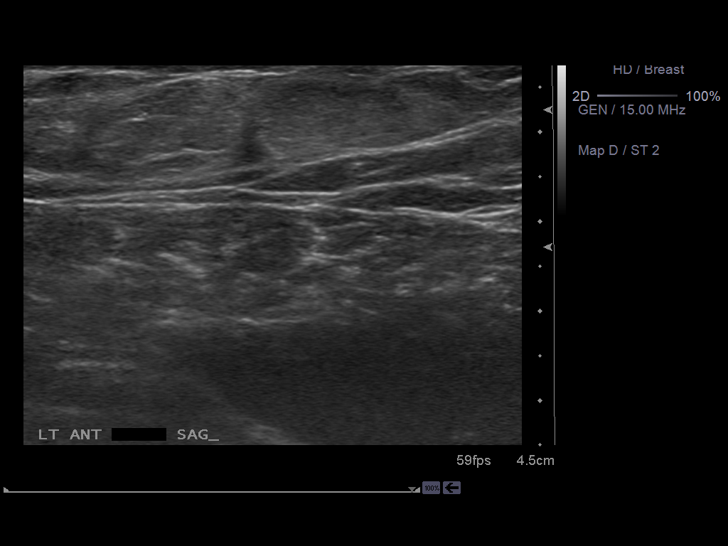
[im 12/13]
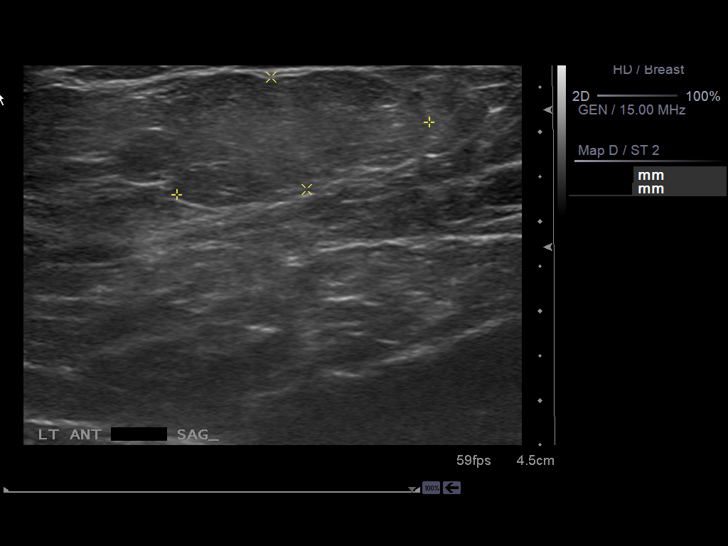
[im 13/13]
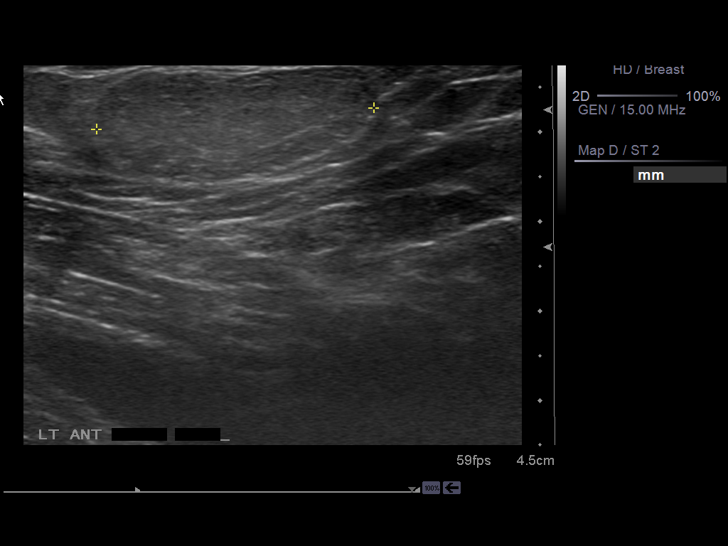

[13 of 13 positions shown; findings below may reference images not displayed]

FINDINGS: Well circumscribed approximately 3.1 x 2.9 1.3 cm in the
left chest wall, with ultrasound characteristics similar to the
remainder of the subcutaneous fat.  Mild acoustic enhancement.  No
acoustical shadowing.
IMPRESSION: Findings consistent with an approximate 3 cm lipoma in the left
chest wall subcutaneous tissues

## 2010-10-23 NOTE — Discharge Summary (Signed)
NAMECONAL, SHETLEY NO.:  [id number]   MEDICAL RECORD NO.:  [id number]          PATIENT TYPE:  INP   LOCATION:  2035                         FACILITY:  [facility]   PHYSICIAN:  Rollene Rotunda, MD, FACCDATE OF BIRTH:  1961-03-02   DATE OF ADMISSION:  10/29/2006  DATE OF DISCHARGE:  10/30/2006                               DISCHARGE SUMMARY   PRIMARY CARDIOLOGIST:  Dr. Rollene Rotunda.   PRIMARY CARE PHYSICIAN:  He does not have one.   PRINCIPAL DIAGNOSIS:  Chest pain.   SECONDARY DIAGNOSES:  1. A history of coronary artery disease, status post myocardial      infarction x2.  Status post 4 stents placed in the past in [place].  2. Hypertension.  3. Hyperlipidemia.  4. Remote polysubstance abuse times about 13 years.  5. History of Hodgkin disease, status post chemotherapy in his 70s.   ALLERGIES:  1. PENICILLIN CAUSES HIVES.  2. CODEINE.   PROCEDURE:  None.   HISTORY OF PRESENT ILLNESS:  [age]-year-old African American male,  prior history of CAD status post MI in 2006 and again in 2007 with a  total of 4 stents placed in [place].  For the past 2 weeks, he has been  having exertional left-sided chest discomfort with aching without  radiation or associated symptoms, lasted a few minutes and resolving  spontaneously.  Symptoms can occur 2-3 times per day.  He recently moved  to [place] from [place] and has been very stressed.  He presented to  the ED on May 21 and was admitted for further evaluation.   HOSPITAL COURSE:  Patient ruled out for MI and his ECG has shown no  acute ST or T changes.  We have arranged for an outpatient exercise  Myoview tomorrow, May 23, at 12 noon.  We have refilled the patient's  prescriptions, which he has run out since moving from [place].  He has  not had any additional discomfort.  Will be discharged home today in  satisfactory condition.   DISCHARGE LABS:  Hemoglobin 14.1, hematocrit 41.8, WBC 5.8,  platelets  283.  Sodium 138, potassium 4.3, chloride 104, CO2 26, BUN 11,  creatinine 0.91, glucose 95.  Total bilirubin 0.5, alkaline phosphatase  72, AST 36, ALT 42.  Total protein 7.1, albumin 4.2, calcium 9.4.  CK  608, MB 3.2, troponin-I 0.03.  Total cholesterol 238, triglycerides 137,  HDL 36, LDL 175.  TSH 1.184.   DISPOSITION:  Patient being discharged home today in good condition.   FOLLOWUP APPOINTMENTS:  He is arranged for an exercise Myoview on Oct 31, 2006 at 12 noon.  He will follow up with Dr. Rollene Rotunda, November 24, 2006, at 3:15 p.m.   DISCHARGE MEDICATIONS:  1. Aspirin 81 mg daily.  2. Plavix 75 mg daily.  3. Metoprolol titrate 100 mg b.i.d.  4. Lescol 40 mg q.h.s.  5. Lisinopril 20 mg daily.  6. Nitroglycerin 0.4 mg sublingual p.r.n. chest pain.   OUTSTANDING LAB STUDIES:  None.   Duration of discharge encounter, 45 minutes including  physician time.      Nicolasa Ducking, ANP      Rollene Rotunda, MD, [facility]  Electronically Signed    CB/MEDQ  D:  10/30/2006  T:  10/30/2006  Job:  [id number]

## 2010-10-23 NOTE — Assessment & Plan Note (Signed)
[facility]                            CARDIOLOGY OFFICE NOTE   Miguel Bell, Miguel Bell                       MRN:          [id number]  DATE:03/19/2007                            DOB:          1960/11/10    PRIMARY CARE:  [facility].   REASON FOR PROCEDURE:  Evaluate patient with coronary disease.   HISTORY OF PRESENT ILLNESS:  The patient presents for followup of the  above.  He has done well since I last saw him.  He is not describing any  chest discomfort, neck or arm discomfort.  He has not been having any  palpitations, presyncope or syncope.  He has not been having any  shortness of breath, PND or orthopnea.  He did get a job at [employer]  as did his wife and he is very excited.   Of note, he did have a stress perfusion in May that demonstrated an  ejection fraction that was perhaps mildly reduced at 40%, but maybe  somewhat underestimated.  There were no high risk areas.   PAST MEDICAL HISTORY:  1. Coronary artery disease (status post myocardial infarction with      stents placed in [place].  I do have the last catheterization from      2006 which demonstrated the circumflex to obtuse marginal 50%      stenosis, managed medically, right coronary artery had a patent      stent.  There was an ostial 80% posterior descending artery lesion      which was managed medically).  2. Mildly reduced ejection fraction.  3. Dyslipidemia.  4. Hypertension.  5. History of non-Hodgkin's disease, status post chemotherapy in his      64s.   ALLERGIES:  PENICILLIN caused hives, CODEINE.   MEDICATIONS:  1. Metoprolol 100 mg b.i.d.  2. Diovan 160 mg daily.  3. Plavix 75 mg daily.  4. Aspirin 325 mg daily.  5. Lisinopril 20 mg daily.   REVIEW OF SYSTEMS:  As stated in the HPI and otherwise negative for  other systems.   PHYSICAL EXAMINATION:  Patient is in no distress.  Blood pressure 159/103, heart rate 74 and regular, weight 265  pounds.  HEENT:  Eyelids unremarkable, pupils are equal, round, and reactive to  light, fundi not visualized.  Oral mucosa unremarkable.  NECK:  No jugular venous distension at 45 degrees, carotid upstrokes  brisk and symmetrical.  No bruit.  No thyromegaly.  LYMPHATICS:  No cervical, axillary, inguinal adenopathy.  LUNGS:  Clear to auscultation bilaterally.  BACK:  No costovertebral angle tenderness.  CHEST:  Unremarkable.  HEART:  PMI not displaced or sustained.  S1 and S2 are within normal  limits.  No S3, no S4.  No clicks, no rubs, no murmurs.  ABDOMEN:  Obese, positive bowel sounds, normal in frequency and pitch.  No bruits, no rebound, no guarding.  No midline pulsatile mass.  No  organomegaly.  SKIN:  No rashes, no nodules.  EXTREMITIES:  2+ pulses, no edema.   EKG sinus rhythm, rate 70, axis within normal limits,  intervals within  normal limits, poor anterior R-wave progression, no acute ST-wave  changes.   ASSESSMENT AND PLAN:  1. Coronary disease:  The patient is not having any symptoms      consistent with ongoing angina.  No further cardiovascular testing      is suggested.  He needs aggressive risk reduction.  2. Hypertension:  Blood pressure is still not at target.  I will      increase his lisinopril to 40 mg a day.  We will followup labs.  3. Dyslipidemia:  I am going to switch him to simvastatin 40 mg      empirically.  In about 8 weeks he will need to get a lipid profile,      liver enzymes and a BMET.  4. Obesity:  He understands the need to lose weight with diet and      exercise, and he has lost 12 pounds, and I applaud his efforts and      congratulate him.  5. Followup:  I will see the patient back in about 3 months or sooner      if needed.     Rollene Rotunda, MD, [facility]  Electronically Signed    JH/MedQ  DD: 03/19/2007  DT: 03/20/2007  Job #: [id number]

## 2010-10-23 NOTE — Assessment & Plan Note (Signed)
[place] [facility]                            CARDIOLOGY OFFICE NOTE   Miguel Bell, Miguel Bell                       MRN:          [id number]  DATE:12/17/2006                            DOB:          [birth year]    REASON FOR PRESENTATION:  Evaluate patient with chest pain.   HISTORY OF PRESENT ILLNESS:  The patient presents for followup of the  above.  He has history of coronary disease as described below.  He has  moved from [place].  He saw [place] on Oct 29, 2006 with atypical chest  discomfort, and was admitted to the hospital.  He ruled out for  myocardial infarction.  He subsequently had an outpatient stress  perfusion study, which demonstrated no high-grade areas of ischemia or  infarct.  His ejection fraction was thought to be, perhaps, mildly  reduced.  He was sent home from the hospital on generic medications, but  unfortunately has not had any of these.  He comes in today somewhat  hypertensive.  He is stressed out because he cannot get his medications  because of finances.  He denies any chest pressure, neck discomfort, or  arm discomfort.  He has had some fleeting chest discomfort sporadic.  He  denies any new shortness of breath.  He has not had any PND or  orthopnea.  He has had no palpitations, presyncope, or syncope.   PAST MEDICAL HISTORY:  1. Coronary artery disease (status post myocardial infarction x2 and 4      stents placed in [place].  I do not have these records, but they      are said to be coming from [place]).  2. Mildly reduced ejection fraction.  3. Hypertension.  4. Dyslipidemia.  5. History of Hodgkin's disease status post chemotherapy in his 53s.   ALLERGIES:  1. PENICILLIN CAUSED HIVES.  2. CODEINE.   MEDICATIONS:  He is currently not taking any.  He was discharged on:  1. Metoprolol 100 mg b.i.d.  2. Plavix 75 mg daily.  3. Lescol 40 mg nightly.  4. Lisinopril 20 mg daily.  5. Aspirin 81 mg daily.   REVIEW OF  SYSTEMS:  As stated in the HPI, and otherwise negative for  other systems.   PHYSICAL EXAMINATION:  Patient is anxious, but in no distress.  Blood pressure 179/114 without orthostatic blood pressure drop.  HEENT:  Eyes unremarkable.  Pupils equal, round, and reactive to light.  Fundi not visualized. Oral mucosa unremarkable.  NECK:  No jugular venous distention at 45 degrees.  Carotid upstroke  brisk and symmetric.  No bruits.  No thyromegaly.  LYMPHATICS:  No cervical, axillary, or inguinal adenopathy.  LUNGS:  Clear to auscultation bilaterally.  BACK:  No costovertebral angle tenderness.  CHEST:  Unremarkable.  HEART:  PMI not displaced or sustained.  S1 and S2 within normal limits.  No S3.  No S4.  No clicks.  No rubs.  No murmurs.  ABDOMEN:  Obese.  Positive bowel sounds.  Normal in frequency and pitch.  No bruits.  No rebound.  No guarding.  No midline pulsatile mass.  No  hepatomegaly.  No splenomegaly.  SKIN:  No rashes.  No nodules.  EXTREMITIES:  Two plus pulses throughout.  No edema.  No cyanosis.  No  clubbing.  NEUROLOGIC:  Oriented to person, place, and time.  Cranial nerves 2  through 12 grossly intact.  Motor grossly intact.  EKG:  Sinus rhythm.  Axis 86.  Intervals within normal limits.  Diffuse  ST elevation consistent with repolarization, and unchanged from his  previous EKG on May 22nd.   ASSESSMENT AND PLAN:  1. Coronary artery disease.  Patient is having no symptoms consistent      with angina.  His EKG does not show any changes from May.  At this      point, no further cardiovascular testing is suggested.  He needs to      get back on his medications.  I explained to him that all of the      drugs he was sent home on, except Plavix, would be 4 dollars or      less, and that is why they were chosen.  He is going to try to get      these medications.  I am going to give him Coreg samples, which is      the only beta blocker I have.  He will get 40 mg of the  continuous      release.  We do not have any ACE inhibitor samples.  He thinks he      could afford this at least.  We will try to look for Plavix samples      as well.  We will try to direct him to [facility].  He is      going to check with [facility].  We are going to work with      companies to try to get him any help that we can for affording his      medications.  2. Hypertension.  As above.  3. Mildly reduced ejection fraction.  He is not having any symptoms      related to this.  I would reassess this once we get him on a stable      regimen.  4. Followup.  We will see him back in a couple of months, though he      can come back in a week or so for blood pressure check when he is      back on his medications.     Rollene Rotunda, MD, [facility]  Electronically Signed    JH/MedQ  DD: 12/17/2006  DT: 12/18/2006  Job #: [id number]

## 2010-10-23 NOTE — Assessment & Plan Note (Signed)
[facility]                            CARDIOLOGY OFFICE NOTE   RAFAN, SANDERS                     MRN:          [id number]  DATE:07/09/2007                            DOB:          [birth year]    PRIMARY CARE PHYSICIAN:  [facility].   REASON FOR PRESENTATION:  Evaluate a patient with coronary disease and  hypertension.   HISTORY OF PRESENT ILLNESS:  The patient presents for followup of the  above.  He is 50 years old.  He has been doing well since I last saw  him. We have him on all generic drugs for cost.  He is taking his  medications.  However, his blood pressure is elevated as below.  He is  not having any new symptoms.  He is not having any chest discomfort,  neck discomfort, arm discomfort, activity induced nausea, vomiting or  excessive diaphoresis.  He is not having any palpation, presyncope or  syncope.  He is having no PND or orthopnea.   The patient does snore.  He may have apneic spells.  His wife commented  on this.  He is tired during the day and he does have headaches.  He has  never been screened for sleep apnea.   PAST MEDICAL HISTORY:  Coronary artery disease (status myocardial  infarction with stents placed in [place]).  Catheterization 2006  demonstrated circumflex obtuse marginal 50% stenosis.  This is managed  medically.  The right coronary artery had a patent stent.  There was an  ostial 80% PDA stenosis which was managed medically.  Mildly reduced  ejection fraction (40%), dyslipidemia, hypertension, history of non-  Hodgkin's disease, status post chemotherapy in his 89s.   ALLERGIES:  PENICILLIN causes hives, CODEINE.   MEDICATIONS:  1. Metoprolol 100 mg b.i.d.  2. Diovan 160 mg daily.  3. Plavix 75 mg daily.  4. Aspirin 325 mg daily.  5. Lisinopril 40 mg daily.  6. Simvastatin 40 mg daily at bedtime.   REVIEW OF SYSTEMS:  As stated in the HPI, otherwise negative for other  systems.   PHYSICAL EXAMINATION:  GENERAL:  The patient is in no distress.  VITAL SIGNS:  Blood pressure 179/99, heart rate 63 and regular, weight  267 pounds, body mass index 35.  HEENT:  Eyes unremarkable.  Pupils equal, round, reactive to light.  Fundi not visualized.  Oral mucosa unremarkable.  NECK:  No jugular venous distention at 45 degrees, carotid upstroke  brisk and symmetric, no bruits, no thyromegaly.  LYMPHATICS:  No cervical, axillary or inguinal adenopathy.  LUNGS:  Clear to auscultation bilaterally.  BACK:  No costovertebral angle tenderness.  CHEST:  Unremarkable.  HEART:  PMI not displaced or sustained, S1 and S2 within normal limits.  No S3, no S4, no clicks, no rubs, no murmurs.  ABDOMEN:  Obese, positive bowel sounds, normal in frequency and pitch.  No bruits, rebound, guarding or midline pulsatile mass.  No  hepatomegaly, no splenomegaly.  SKIN:  No rash.  EXTREMITIES:  2+ pulses throughout.  No edema, cyanosis or clubbing.  NEUROLOGICAL:  Oriented  to person, place and time.  Cranial nerves II-  XII grossly intact, motor grossly intact throughout.   EKG sinus rhythm, rate 63, axis within normal limits, intervals within  normal limits, poor anterior R wave progression, no acute ST wave  change.   ASSESSMENT AND PLAN:  1. Coronary disease.  The patient is having no symptoms related to      this.  No further cardiovascular testing is suggested.  He had a      stress perfusion study in May of 2008.  2. Hypertension.  Blood pressure is still not controlled.  I am going      to add spironolactone 25 mg daily.  He has had normal BUN,      creatinine and potassium in the past.  We will get a BMET in 1      week.  We discussed the potential side effects of this and the      hyperkalemia.  He understands and agrees to proceed.  He knows he      needs to lose weight.  In addition, he may have sleep apnea and      will screen as below.  3. Sleep apnea.  The patient almost  definitely have sleep apnea.  This      may contribute to his cardiomyopathy and his hypertension and other      symptoms.  At this point I will screen him with a sleep study and      refer to a sleep physician.  4. Obesity.  He understands he needs to lose weight with diet and      exercise.  5. Cardiomyopathy.  We will manage this in the context of treating his      hypertension and sleep apnea.  6. Risk reduction.  It is not clear that he is taking his simvastatin      as prescribed.  I have reiterated the need for this.  We will      follow up with lipid profiles and liver enzymes in the future.  7. Followup.  I would like to see him back in about 1 month as he will      probably need further med adjustment.     Rollene Rotunda, MD, [facility]  Electronically Signed    JH/MedQ  DD: 07/09/2007  DT: 07/10/2007  Job #: [id number]

## 2010-10-23 NOTE — Assessment & Plan Note (Signed)
[facility]                            CARDIOLOGY OFFICE NOTE   VIKAS, WEGMANN                     MRN:          [id number]  DATE:03/16/2008                            DOB:          December 15, 1960    PRIMARY CARDIOLOGIST:  Rollene Rotunda, MD, [facility]    HISTORY OF PRESENT ILLNESS:  Mr. Miguel Bell is a 50 year old African American  male patient of Dr. Jenene Slicker who has a history of coronary artery  disease and hypertension.  His medications were adjusted back in January  by Dr. Antoine Poche and he was supposed to follow up in a month.  He lost  his job at [employer] in March and he has been out of his medications  since then.  He has had episodes of dizziness and not feeling well and  knows his blood pressures have been elevated.  He comes in today to get  some prescriptions that are 4 dollars at [facility] and assistance with  Plavix, which he is taking for stents that were placed while living in  [place].  As stated before, he has been off all these medications since  March.   MEDICATIONS:  He was on prior to this were,  1. Metoprolol 100 mg b.i.d.  2. Diovan 160 mg daily.  3. Plavix 75 mg daily.  4. Aspirin 325 daily.  5. Lisinopril 20 mg daily.   PHYSICAL EXAMINATION:  GENERAL:  This is a pleasant overweight 47-year-  old, African American male, in no acute distress.  VITAL SIGNS:  Blood pressure 190/100, pulse 92, weight 296.  NECK:  Without JVD, HJR bruit, or thyroid enlargement.  LUNGS:  Clear anterior, posterior, and lateral.  HEART:  Regular rate and rhythm at 92 beats per minute.  Normal S1 and  S2.  Positive S4.  No murmur, rub, bruit, thrill, or heave noted.  ABDOMEN:  Soft without organomegaly, masses, lesions, or abnormal  tenderness.  EXTREMITIES:  Without cyanosis, clubbing, or edema.  He has good distal  pulses.   IMPRESSION:  1. Hypertension uncontrolled, off all medications.  2. Coronary artery disease status post myocardial  infarction with      stents placed in [place], catheterization in 2006, 50% circumflex,      patent right coronary artery stent.  He had ostial 80% posterior      descending artery stenosis, ejection fraction 40%.  3. Dyslipidemia.  4. History of non-Hodgkin disease status post chemotherapy in his 47s.  5. Obesity  6. Probable sleep apnea.   PLAN:  I have given him a prescription for lisinopril and  hydrochlorothiazide 20/25 mg daily, 90-day supply, these are all [facility]  [facility], metoprolol 100 mg b.i.d.  [facility] does not have  any AR2Bs, so we will give him clonidine 0.1 mg once a day.  We also  have filled out forms for Plavix assistance, which Dr. Antoine Poche will  need to sign.  I have talked to him about low-sodium diet, the  importance of not stopping his medications and he will see Dr. Antoine Poche  back in followup.      Elon Jester  Geni Bers, PA-C  Electronically Signed      Doylene Canning. Ladona Ridgel, MD  Electronically Signed   ML/MedQ  DD: 03/16/2008  DT: 03/17/2008  Job #: [id number]

## 2010-10-23 NOTE — H&P (Signed)
NAMEFERGUSON, GERTNER              ACCOUNT NO.:  [id number]   MEDICAL RECORD NO.:  [id number]          PATIENT TYPE:  INP   LOCATION:  1852                         FACILITY:  [facility]   PHYSICIAN:  Peggye Pitt, M.D. DATE OF BIRTH:  May 24, 1961   DATE OF ADMISSION:  10/29/2006  DATE OF DISCHARGE:                              HISTORY & PHYSICAL   REASON FOR ADMISSION:  Chest pain with known coronary artery disease.   HISTORY AND PHYSICAL EXAMINATION:  Mr. Boze is a 50 year old African-  American man with known coronary artery disease, status post MI x2 in  2006 and 2007, with 4 drug-eluting stents placed.  All records from [place] are currently unavailable.  He presents to [facility] ED for  evaluation of chest pain.  For the past 2 weeks, he has been having left-  sided chest pain that he describes as achy with no radiation, lasting a  few minutes at a time, brought on by minimal to moderate exertion such  as walking his dog.  He is having about 2-3 episodes per day.  He denies  any shortness of breath, nausea, vomiting but does describe some  dizziness with blurred vision.  He has finally settled down in  [place] after going back and forth to and from [place] because of  his job.  He has been very stressed out and finally decided to quit.  He  states that this emotional stress does bring on the chest pain.   ALLERGIES:  He has stated allergies to PENICILLIN which causes hives,  and CODEINE.   PAST MEDICAL HISTORY:  1. History of Hodgkin disease status post chemo when he was in his      37s.  2. He has had MI x2 with 4 drug-eluting stents, details at this time      are unavailable.  3. Hypertension for years.  4. Hyperlipidemia for years.  5. Remote substance abuse.   HOME MEDS:  Consist of Plavix 75 mg, aspirin 81 mg, Diovan at an unknown  dose, and metoprolol at an unknown dose.   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  He is married.  He has no children  because he is on sterile following chemo for his Hodgkin disease.  He is  a former smoker, quit over 15 years ago.  Denies any alcohol abuse.  Does have remote cocaine and marijuana use, again over 13-15 years ago.   FAMILY HISTORY:  Significant for his mother with coronary artery disease  with MI in her late 71s or early 7s.   PHYSICAL EXAMINATION:  VITAL SIGNS: Upon admission showed blood pressure  166/111, heart rate 92, respirations 18, temperature 98.7, O2 sat 94% on  room air.  GENERAL:  He was alert, awake, oriented x3, with some mild chest pain.  NECK:  No JVD and no bruits.  CARDIOVASCULAR:  Regular rate and rhythm with no murmurs, rubs or  gallops.  LUNGS:  Clear to auscultation bilaterally.  ABDOMEN:  Soft, nontender, nondistended with  positive bowel sounds.  EXTREMITIES:  No edema.  Positive pulses.   LABS:  Labs available at time of this dictation:  We have an i-STAT  drawn in the ED.  This showed a sodium of 139, potassium 3.6, chloride  108, bicarb 26, BUN 14, creatinine 1.1, with a glucose of 91.  Point of  care markers:  First set with a myoglobin of 109, CK-MB of 3.5 and  troponin of less than 0.05; second set, with a myoglobin of 102, CK-MB  of 2.6 and troponin 0.07.  EKG done in the ER shows normal sinus rhythm  with a rate of 82 with normal axis with Q waves in V1 through V3, and  questionable left atrial enlargement.  There are no prior EKGs for  comparison.   ASSESSMENT AND PLAN:  This is a 50 year old man with known coronary  artery disease presenting with chest pain.  We will admit him to  telemetry, rule him out for acute coronary syndrome with three sets of  cardiac enzymes.  We will also cycle his EKG.  We will optimize his  heart regimen to include beta blockers, aspirin, Plavix, to improve his  blood pressure control.  Possibly add an ACE inhibitor if his blood  pressure is not controlled.   We will check a fasting lipid panel in the  morning to see if we need a statin as well.  We will start him on a  nitroglycerin drip for chest pain control and on a heparin drip.  We  will check a 2-D echo and chest x-ray.  We will keep him n.p.o. after  midnight and Dr. Antoine Poche has already scheduled a stress Myoview for him  on Friday the 23rd, unless he develops any EKG changes or rising  enzymes.  In that case, we will send him directly to cardiac cath.      Peggye Pitt, M.D.  Electronically Signed     EH/MEDQ  D:  10/29/2006  T:  10/30/2006  Job:  [id number]

## 2010-12-27 ENCOUNTER — Encounter: Payer: Self-pay | Admitting: Cardiology

## 2011-01-29 ENCOUNTER — Ambulatory Visit (INDEPENDENT_AMBULATORY_CARE_PROVIDER_SITE_OTHER): Payer: Medicare Other | Admitting: Cardiology

## 2011-01-29 ENCOUNTER — Encounter: Payer: Self-pay | Admitting: Cardiology

## 2011-01-29 DIAGNOSIS — I10 Essential (primary) hypertension: Secondary | ICD-10-CM

## 2011-01-29 DIAGNOSIS — E785 Hyperlipidemia, unspecified: Secondary | ICD-10-CM

## 2011-01-29 DIAGNOSIS — I2589 Other forms of chronic ischemic heart disease: Secondary | ICD-10-CM

## 2011-01-29 DIAGNOSIS — M109 Gout, unspecified: Secondary | ICD-10-CM

## 2011-01-29 DIAGNOSIS — I2510 Atherosclerotic heart disease of native coronary artery without angina pectoris: Secondary | ICD-10-CM

## 2011-01-29 MED ORDER — CLOPIDOGREL BISULFATE 75 MG PO TABS: 75.0000 mg | ORAL_TABLET | Freq: Every day | ORAL | Status: DC

## 2011-01-29 NOTE — Assessment & Plan Note (Signed)
I will order a fasting lipid profile

## 2011-01-29 NOTE — Progress Notes (Signed)
HPI The patient presents for followup of coronary disease and a mildly reduced ejection fraction. He has been gone from the practice for some time having missed many appointments. These had no further cardiovascular testing since I last saw him. He has had no apparent hospitalizations. He denies any chest pressure, neck or arm discomfort. He has had no palpitations, presyncope or syncope. He does walk his dog twice daily. He says he does have fatigue. This has been unchanged.  Allergies  Allergen Reactions  . Codeine Sulfate     REACTION: rash  . Penicillins     Current Outpatient Prescriptions  Medication Sig Dispense Refill  . aspirin 325 MG EC tablet Take 325 mg by mouth daily.        . cloNIDine (CATAPRES) 0.1 MG tablet Take 0.1 mg by mouth 2 (two) times daily.        . clopidogrel (PLAVIX) 75 MG tablet Take 75 mg by mouth daily.        Marland Kitchen escitalopram (LEXAPRO) 10 MG tablet Take 30 mg by mouth daily.        . hydrOXYzine (VISTARIL) 25 MG capsule Take 25 mg by mouth 3 (three) times daily as needed.        Marland Kitchen lisinopril-hydrochlorothiazide (PRINZIDE,ZESTORETIC) 20-25 MG per tablet Take 1 tablet by mouth daily.        . metoprolol (LOPRESSOR) 100 MG tablet Take 100 mg by mouth 2 (two) times daily.        . mirtazapine (REMERON) 15 MG tablet Take 7.5 mg by mouth at bedtime.        . Multiple Vitamin (MULTI-VITAMIN PO) Take by mouth daily.        . nitroGLYCERIN (NITROSTAT) 0.4 MG SL tablet Place 0.4 mg under the tongue every 5 (five) minutes as needed.        . triamcinolone (KENALOG) 0.1 % cream Apply to area on neck two times a day as needed (do not use more than 2 weeks straight)         Past Medical History  Diagnosis Date  . HTN (hypertension)   . CAD (coronary artery disease)     s/p MI in [place] cath 2006  . Hyperlipidemia   . Sleep apnea     probable    Past Surgical History  Procedure Date  . Coronary angioplasty with stent placement     ROS:  As stated in the HPI and  negative for all other systems.  PHYSICAL EXAM BP 132/102  Pulse 61  Resp 16  Ht 6' (1.829 m)  Wt 284 lb (128.822 kg)  BMI 38.52 kg/m2 GENERAL:  Well appearing HEENT:  Pupils equal round and reactive, fundi not visualized, oral mucosa unremarkable NECK:  No jugular venous distention, waveform within normal limits, carotid upstroke brisk and symmetric, no bruits, no thyromegaly LYMPHATICS:  No cervical, inguinal adenopathy LUNGS:  Clear to auscultation bilaterally BACK:  No CVA tenderness CHEST:  Unremarkable HEART:  PMI not displaced or sustained,S1 and S2 within normal limits, no S3, no S4, no clicks, no rubs, no murmurs ABD:  Flat, positive bowel sounds normal in frequency in pitch, no bruits, no rebound, no guarding, no midline pulsatile mass, no hepatomegaly, no splenomegaly EXT:  2 plus pulses throughout, no edema, no cyanosis no clubbing SKIN:  No rashes no nodules NEURO:  Cranial nerves II through XII grossly intact, motor grossly intact throughout PSYCH:  Cognitively intact, oriented to person place and time  EKG:  Sinus rhythm,  rate 63, axis within normal limits, poor R-wave progression, probable old anteroseptal infarct.  ASSESSMENT AND PLAN

## 2011-01-29 NOTE — Assessment & Plan Note (Signed)
I order an echo to follow up in his previously mildly reduced EF

## 2011-01-29 NOTE — Assessment & Plan Note (Signed)
He needs continued risk reduction.  I will bring the patient back for a POET (Plain Old Exercise Test). This will allow me to screen for obstructive coronary disease, risk stratify and very importantly provide a prescription for exercise.

## 2011-01-29 NOTE — Assessment & Plan Note (Signed)
He should get a BP cuff and keep a diary.  He also needs to lose weight.  He, for now, will continue the current meds.

## 2011-01-29 NOTE — Patient Instructions (Signed)
Your physician has requested that you have an exercise tolerance test. For further information please visit [email or web address] Please also follow instruction sheet, as given.  Your physician has requested that you have an echocardiogram. Echocardiography is a painless test that uses sound waves to create images of your heart. It provides your doctor with information about the size and shape of your heart and how well your heart's chambers and valves are working. This procedure takes approximately one hour. There are no restrictions for this procedure.  Your physician recommends that you return for a FASTING lipid profile: same day as treadmill and echo  The current medical regimen is effective;  continue present plan and medications.

## 2011-02-22 ENCOUNTER — Encounter: Payer: Medicare Other | Admitting: Cardiology

## 2011-02-22 ENCOUNTER — Other Ambulatory Visit (HOSPITAL_COMMUNITY): Payer: Medicare Other | Admitting: Radiology

## 2011-02-22 ENCOUNTER — Other Ambulatory Visit: Payer: Medicare Other | Admitting: *Deleted

## 2011-02-27 ENCOUNTER — Encounter: Payer: Self-pay | Admitting: *Deleted

## 2011-03-18 ENCOUNTER — Other Ambulatory Visit (HOSPITAL_COMMUNITY): Payer: Medicare Other | Admitting: Radiology

## 2011-03-18 ENCOUNTER — Telehealth: Payer: Self-pay | Admitting: Cardiology

## 2011-03-18 ENCOUNTER — Other Ambulatory Visit: Payer: Medicare Other | Admitting: *Deleted

## 2011-03-18 ENCOUNTER — Encounter: Payer: Medicare Other | Admitting: Cardiology

## 2011-03-18 NOTE — Telephone Encounter (Signed)
noted 

## 2011-03-18 NOTE — Telephone Encounter (Signed)
Pt wife calling to cancel ECHO and treadmill. Pt wife said that pt needs to take multiple buses to get to our office and the weather is bad therefore pt cannot get her. Pt wife said she would call back to reschedule at a later date.

## 2011-05-07 ENCOUNTER — Encounter: Payer: Medicare Other | Admitting: Cardiology

## 2011-05-07 ENCOUNTER — Other Ambulatory Visit: Payer: Medicare Other | Admitting: *Deleted

## 2011-05-07 ENCOUNTER — Other Ambulatory Visit (HOSPITAL_COMMUNITY): Payer: Medicare Other | Admitting: Radiology

## 2011-08-08 ENCOUNTER — Other Ambulatory Visit: Payer: Self-pay

## 2011-08-08 ENCOUNTER — Other Ambulatory Visit (INDEPENDENT_AMBULATORY_CARE_PROVIDER_SITE_OTHER): Payer: Self-pay

## 2011-08-08 ENCOUNTER — Ambulatory Visit (HOSPITAL_COMMUNITY): Payer: Self-pay | Attending: Cardiology

## 2011-08-08 DIAGNOSIS — I10 Essential (primary) hypertension: Secondary | ICD-10-CM | POA: Insufficient documentation

## 2011-08-08 DIAGNOSIS — E785 Hyperlipidemia, unspecified: Secondary | ICD-10-CM | POA: Insufficient documentation

## 2011-08-08 DIAGNOSIS — I2510 Atherosclerotic heart disease of native coronary artery without angina pectoris: Secondary | ICD-10-CM | POA: Insufficient documentation

## 2011-08-08 DIAGNOSIS — I2589 Other forms of chronic ischemic heart disease: Secondary | ICD-10-CM | POA: Insufficient documentation

## 2011-08-08 LAB — LIPID PANEL
Cholesterol: 246 mg/dL — ABNORMAL HIGH (ref 0–200)
Total CHOL/HDL Ratio: 6
Triglycerides: 240 mg/dL — ABNORMAL HIGH (ref 0.0–149.0)

## 2011-08-14 ENCOUNTER — Ambulatory Visit (INDEPENDENT_AMBULATORY_CARE_PROVIDER_SITE_OTHER): Payer: Self-pay | Admitting: Physician Assistant

## 2011-08-14 DIAGNOSIS — I2510 Atherosclerotic heart disease of native coronary artery without angina pectoris: Secondary | ICD-10-CM

## 2011-08-14 NOTE — Progress Notes (Signed)
Exercise Treadmill Test  Pre-Exercise Testing Evaluation Rhythm: normal sinus  Rate: 73   PR:  20 QRS:  .09  QT:  .38 QTc: .42     Test  Exercise Tolerance Test Ordering MD: Angelina Sheriff, MD  Interpreting MD:  Tereso Newcomer PA-C  Unique Test No: 1  Treadmill:  1  Indication for ETT: known ASHD  Contraindication to ETT: No   Stress Modality: exercise - treadmill  Cardiac Imaging Performed: non   Protocol: standard Bruce - maximal  Max BP:  172/81  Max MPHR (bpm):  170 85% MPR (bpm):  145  MPHR obtained (bpm):  104 % MPHR obtained:  61%  Reached 85% MPHR (min:sec):  N/A Total Exercise Time (min-sec):  5:23  Workload in METS:  7.0 Borg Scale: 16  Reason ETT Terminated:  patient's desire to stop    ST Segment Analysis At Rest: non-specific ST segment slurring With Exercise: non-specific ST changes  Other Information Arrhythmia:  No Angina during ETT:  absent (0) Quality of ETT:  non-diagnostic  ETT Interpretation:  normal - no evidence of ischemia by ST analysis at submaximal exercise.  Comments: Poor exercise tolerance. No chest pain. He is limited by bilateral hip pain and had to stop exercise because of this. Normal BP response to exercise. No ST-T changes to suggest ischemia at submaximal exercise.   Recommendations: Arrange Lexiscan Myoview. Tereso Newcomer, PA-C  3:01 PM 08/14/2011

## 2011-08-23 ENCOUNTER — Other Ambulatory Visit: Payer: Self-pay | Admitting: *Deleted

## 2011-08-23 DIAGNOSIS — Z79899 Other long term (current) drug therapy: Secondary | ICD-10-CM

## 2011-08-23 DIAGNOSIS — E7800 Pure hypercholesterolemia, unspecified: Secondary | ICD-10-CM

## 2011-08-23 MED ORDER — ATORVASTATIN CALCIUM 20 MG PO TABS: 20.0000 mg | ORAL_TABLET | Freq: Every day | ORAL | Status: DC

## 2011-08-28 ENCOUNTER — Other Ambulatory Visit (HOSPITAL_COMMUNITY): Payer: Self-pay

## 2011-09-04 ENCOUNTER — Ambulatory Visit (HOSPITAL_COMMUNITY): Payer: Self-pay | Attending: Cardiology | Admitting: Radiology

## 2011-09-04 VITALS — BP 117/87 | Ht 72.0 in | Wt 287.0 lb

## 2011-09-04 DIAGNOSIS — R0989 Other specified symptoms and signs involving the circulatory and respiratory systems: Secondary | ICD-10-CM | POA: Insufficient documentation

## 2011-09-04 DIAGNOSIS — R5383 Other fatigue: Secondary | ICD-10-CM | POA: Insufficient documentation

## 2011-09-04 DIAGNOSIS — I252 Old myocardial infarction: Secondary | ICD-10-CM | POA: Insufficient documentation

## 2011-09-04 DIAGNOSIS — R000 Tachycardia, unspecified: Secondary | ICD-10-CM | POA: Insufficient documentation

## 2011-09-04 DIAGNOSIS — I10 Essential (primary) hypertension: Secondary | ICD-10-CM | POA: Insufficient documentation

## 2011-09-04 DIAGNOSIS — I2510 Atherosclerotic heart disease of native coronary artery without angina pectoris: Secondary | ICD-10-CM | POA: Insufficient documentation

## 2011-09-04 DIAGNOSIS — R002 Palpitations: Secondary | ICD-10-CM | POA: Insufficient documentation

## 2011-09-04 DIAGNOSIS — Z8249 Family history of ischemic heart disease and other diseases of the circulatory system: Secondary | ICD-10-CM | POA: Insufficient documentation

## 2011-09-04 DIAGNOSIS — R0609 Other forms of dyspnea: Secondary | ICD-10-CM | POA: Insufficient documentation

## 2011-09-04 DIAGNOSIS — Z87891 Personal history of nicotine dependence: Secondary | ICD-10-CM | POA: Insufficient documentation

## 2011-09-04 DIAGNOSIS — R0602 Shortness of breath: Secondary | ICD-10-CM

## 2011-09-04 DIAGNOSIS — E669 Obesity, unspecified: Secondary | ICD-10-CM | POA: Insufficient documentation

## 2011-09-04 DIAGNOSIS — E785 Hyperlipidemia, unspecified: Secondary | ICD-10-CM | POA: Insufficient documentation

## 2011-09-04 DIAGNOSIS — R5381 Other malaise: Secondary | ICD-10-CM | POA: Insufficient documentation

## 2011-09-04 MED ORDER — REGADENOSON 0.4 MG/5ML IV SOLN
0.4000 mg | Freq: Once | INTRAVENOUS | Status: AC
  Administered 2011-09-04: 0.4 mg via INTRAVENOUS

## 2011-09-04 MED ORDER — TECHNETIUM TC 99M TETROFOSMIN IV KIT
11.0000 | PACK | Freq: Once | INTRAVENOUS | Status: AC | PRN
  Administered 2011-09-04: 11 via INTRAVENOUS

## 2011-09-04 MED ORDER — TECHNETIUM TC 99M TETROFOSMIN IV KIT
33.0000 | PACK | Freq: Once | INTRAVENOUS | Status: AC | PRN
  Administered 2011-09-04: 33 via INTRAVENOUS

## 2011-09-04 NOTE — Progress Notes (Addendum)
[facility] SITE 3 NUCLEAR MED [address] [place] [address] [phone]  Cardiology Nuclear Med Study  ANAKIN VARKEY is a 51 y.o. male     MRN : [id number]     DOB: 06-27-1960  Procedure Date: 09/04/2011  Nuclear Med Background Indication for Stress Test:  Evaluation for Ischemia and Stent Patency History:  '06 MI>Stent-RCA/CFX; '10 MPS:No ischemia, EF=42%; 2/13 Echo:40-50%; 08/14/11 [id number] d/t hip pain Cardiac Risk Factors: Family History - CAD, History of Smoking, Hypertension, Lipids and Obesity  Symptoms:  DOE, Fatigue, Palpitations and Rapid HR   Nuclear Pre-Procedure Caffeine/Decaff Intake:  None NPO After: 8:30pm   Lungs:  clear O2 Sat: 98% on room air. IV 0.9% NS with Angio Cath:  20g  IV Site: R Hand  IV Started by:  Bonnita Levan, RN  Chest Size (in):  50+ Cup Size: n/a  Height: 6' (1.829 m)  Weight:  287 lb (130.182 kg)  BMI:  Body mass index is 38.92 kg/(m^2). Tech Comments:  All medications taken as directed.    Nuclear Med Study 1 or 2 day study: 1 day  Stress Test Type:  Eugenie Birks  Reading MD: Marca Ancona, MD  Order Authorizing Provider:  Rollene Rotunda, MD  Resting Radionuclide: Technetium 2m Tetrofosmin  Resting Radionuclide Dose: 10.8 mCi   Stress Radionuclide:  Technetium 2m Tetrofosmin  Stress Radionuclide Dose: 33.0 mCi           Stress Protocol Rest HR: 58 Stress HR: 87  Rest BP: 117/87 Stress BP: 149/73  Exercise Time (min): 2:00 METS: n/a   Predicted Max HR: 170 bpm % Max HR: 51.18 bpm Rate Pressure Product: 29528   Dose of Adenosine (mg):  n/a Dose of Lexiscan: 0.4 mg  Dose of Atropine (mg): n/a Dose of Dobutamine: n/a mcg/kg/min (at max HR)  Stress Test Technologist: Smiley Houseman, CMA-N  Nuclear Technologist:  Domenic Polite, CNMT     Rest Procedure:  Myocardial perfusion imaging was performed at rest 45 minutes following the intravenous administration of Technetium 29m Tetrofosmin.  Rest ECG:  Nonspecific T-wave changes, 1st degree AVB and prior ASWMI.  Stress Procedure:  The patient received IV Lexiscan 0.4 mg over 15-seconds with concurrent low level exercise and then Technetium 34m Tetrofosmin was injected at 30-seconds while the patient continued walking one more minute. There were no significant changes with Lexiscan. Quantitative spect images were obtained after a 45-minute delay.  Stress ECG: No significant change from baseline ECG  QPS Raw Data Images:  Normal; no motion artifact; normal heart/lung ratio. Stress Images:  Small, mild basal inferior perfusion defect.  Rest Images:  Small, mild basal inferior perfusion defect.  Subtraction (SDS):  Small fixed basal inferior perfusion defect.  Transient Ischemic Dilatation (Normal <1.22): 1.02 Lung/Heart Ratio (Normal <0.45):  0.44  Quantitative Gated Spect Images QGS EDV:  179 ml QGS ESV:  110 ml  Impression Exercise Capacity:  Lexiscan with low level exercise. BP Response:  Normal blood pressure response. Clinical Symptoms:  Short of breath and lightheaded.  ECG Impression:  Old ASMI, no change with infusion.  Comparison with Prior Nuclear Study: No images to compare  Overall Impression:  Low risk stress nuclear study.  LV Ejection Fraction: 39%.  LV Wall Motion:  Inferior hypokinesis.  Small fixed basal inferior perfusion defect with associated wall motion abnormality suggests prior MI.  No ischemia noted.   Dazhane Villagomez [employer]

## 2011-09-09 ENCOUNTER — Telehealth: Payer: Self-pay | Admitting: *Deleted

## 2011-09-09 NOTE — Telephone Encounter (Signed)
recording comes on and states person @ this # cannot take calls right now. I wcb tomorrow 09/10/11. Danielle Rankin

## 2011-09-09 NOTE — Telephone Encounter (Signed)
Message copied by Tarri Fuller on Mon Sep 09, 2011  5:20 PM ------      Message from: [place], [place] T      Created: Mon Sep 09, 2011  2:48 PM       See below      Myoview results stable      Continue med Rx       Tereso Newcomer, [place]  2:48 PM 09/09/2011

## 2011-09-10 NOTE — Telephone Encounter (Signed)
tried tcb and still got the same message that person @ # cannot take calls right now. Miguel Bell  

## 2011-09-10 NOTE — Telephone Encounter (Signed)
tried tcb and still got the same message that person @ # cannot take calls right now. Danielle Rankin

## 2011-10-04 ENCOUNTER — Telehealth: Payer: Self-pay | Admitting: Cardiology

## 2011-10-04 DIAGNOSIS — I2510 Atherosclerotic heart disease of native coronary artery without angina pectoris: Secondary | ICD-10-CM

## 2011-10-04 DIAGNOSIS — I2589 Other forms of chronic ischemic heart disease: Secondary | ICD-10-CM

## 2011-10-04 NOTE — Telephone Encounter (Signed)
Pt's wife calling re results of stress and echo

## 2011-10-04 NOTE — Telephone Encounter (Signed)
Pt called and given results ECHO--also advised  Dr Antoine Poche will go over stress test at next o.v.--pt was told stress test showed no significant changes--nt

## 2011-10-15 ENCOUNTER — Encounter (HOSPITAL_COMMUNITY): Payer: Self-pay | Admitting: Emergency Medicine

## 2011-10-15 ENCOUNTER — Emergency Department (INDEPENDENT_AMBULATORY_CARE_PROVIDER_SITE_OTHER)
Admission: EM | Admit: 2011-10-15 | Discharge: 2011-10-15 | Disposition: A | Discharge status: Home / Self Care | Payer: Self-pay | Source: Home / Self Care | Attending: Family Medicine | Admitting: Family Medicine

## 2011-10-15 DIAGNOSIS — R3129 Other microscopic hematuria: Secondary | ICD-10-CM

## 2011-10-15 DIAGNOSIS — E119 Type 2 diabetes mellitus without complications: Secondary | ICD-10-CM

## 2011-10-15 LAB — POCT I-STAT, CHEM 8
Creatinine, Ser: 1.1 mg/dL (ref 0.50–1.35)
HCT: 42 % (ref 39.0–52.0)
Hemoglobin: 14.3 g/dL (ref 13.0–17.0)
Potassium: 3.8 mEq/L (ref 3.5–5.1)
Sodium: 135 mEq/L (ref 135–145)

## 2011-10-15 LAB — POCT URINALYSIS DIP (DEVICE)
Glucose, UA: >=1000 mg/dL — AB
Leukocytes, UA: NEGATIVE
Nitrite: NEGATIVE
Specific Gravity, Urine: 1.015 (ref 1.005–1.030)
Urobilinogen, UA: 0.2 mg/dL (ref 0.0–1.0)

## 2011-10-15 MED ORDER — CLOPIDOGREL BISULFATE 75 MG PO TABS: 75.0000 mg | ORAL_TABLET | Freq: Every day | ORAL | Status: DC

## 2011-10-15 MED ORDER — GLIPIZIDE ER 2.5 MG PO TB24: 2.5000 mg | ORAL_TABLET | Freq: Every day | ORAL | Status: DC

## 2011-10-15 NOTE — Discharge Instructions (Signed)
You have sugar in your urine.  And your blood sugar is also high. You need to have fasting blood work at your primary care provider's office for an accurate diagnosis of diabetes. Start taking the prescribed medication as instructed onto the doses change by your primary care doctor. Followup with the urologist as scheduled to followup this finding of blood in your urine. Need to make an appointment with your primary care provider during the next 3-7 days. Go to the emergency department if worsening symptoms or new symptoms like nausea, vomiting, dizziness, body aches or pain.

## 2011-10-15 NOTE — Telephone Encounter (Signed)
Fu call °Pt's wife returning your call °

## 2011-10-15 NOTE — ED Notes (Signed)
Pt. Stated, i was sick about  a week ago with a cold and a cough and during that time I would urinate and some blood would come out, and when my cold got better the blood stopped 3 days ago.  Just concerned and i still have a cough.

## 2011-10-15 NOTE — Telephone Encounter (Signed)
Spoke with wife re pt's bleeding from penis.  She was instructed to have pt call and be seen by his primary care MD to rule out any possible causes.  She is in agreement.  She is concerned that the bleed is coming from him being on Plavix.  He has been on Plavix for over 5 years and this has occurred a couple of times.  Pt will however have this evaluated by his PCP first.

## 2011-10-15 NOTE — Telephone Encounter (Signed)
Left message for pt and/or wife to call back

## 2011-10-15 NOTE — Telephone Encounter (Signed)
New problem:  Patient wife calling. C/O having side effect from plavix Bleeding from penis. Suggestion alternate medication.

## 2011-10-16 NOTE — ED Provider Notes (Signed)
History     CSN: [id number]  Arrival date & time 10/15/11  1801   First MD Initiated Contact with Patient 10/15/11 1837      Chief Complaint  Patient presents with  . Hematuria    (Consider location/radiation/quality/duration/timing/severity/associated sxs/prior treatment) HPI Comments: 51 y/o obese former smoker male with h/o dyslipidemia, CAD s/p stent and glucose intolerance. Here concerned about blood in urine. Was sick with cold symptoms during the week, observed red discoloration of his urine intermitently for 1-2 days. Denies pain with urination. States he is feeling much better now and his cold symptoms have greatly improved, although still has some residual non productive cough, has not seen blood in urine for 3 days. Called his primary care doctor Dr. Philipp Deputy for an acute visit and was asked to come here instead. He also made an appointment with urologist next month after his medicaid gets renewed. No taking medications for diabetes.   Past Medical History  Diagnosis Date  . HTN (hypertension)   . CAD (coronary artery disease)     s/p MI in [place] cath 2006, stent RCA, 50% circ, ostial PDA 80%  . Hyperlipidemia   . Cardiomyopathy     EF 40%    Past Surgical History  Procedure Date  . Coronary angioplasty with stent placement     Family History  Problem Relation Age of Onset  . Coronary artery disease Mother     MI inher late 31s or early 68s  . Breast cancer Other     aunts  . Colon cancer Neg Hx   . Prostate cancer Neg Hx     History  Substance Use Topics  . Smoking status: Former Games developer  . Smokeless tobacco: Not on file  . Alcohol Use: No      Review of Systems  Constitutional: Negative for fever and chills.  HENT: Negative for sore throat.   Respiratory: Positive for cough. Negative for chest tightness, shortness of breath and wheezing.   Cardiovascular: Negative for chest pain and leg swelling.  Gastrointestinal: Negative for nausea, vomiting,  abdominal pain and diarrhea.  Genitourinary: Positive for hematuria. Negative for dysuria, frequency, flank pain, discharge and penile pain.  Musculoskeletal: Negative for myalgias and arthralgias.  Neurological: Negative for dizziness and headaches.    Allergies  Codeine sulfate and Penicillins  Home Medications   Current Outpatient Rx  Name Route Sig Dispense Refill  . ASPIRIN 325 MG PO TBEC Oral Take 325 mg by mouth daily.      . ATORVASTATIN CALCIUM 20 MG PO TABS Oral Take 1 tablet (20 mg total) by mouth daily. 30 tablet 11    May use atorvastatin if less expensive for pt  . CLONIDINE HCL 0.1 MG PO TABS Oral Take 0.1 mg by mouth 2 (two) times daily.      Marland Kitchen CLOPIDOGREL BISULFATE 75 MG PO TABS Oral Take 1 tablet (75 mg total) by mouth daily. 30 tablet 11  . ESCITALOPRAM OXALATE 10 MG PO TABS Oral Take 30 mg by mouth daily.      Marland Kitchen GLIPIZIDE ER 2.5 MG PO TB24 Oral Take 1 tablet (2.5 mg total) by mouth daily. 30 tablet 0  . HYDROXYZINE PAMOATE 25 MG PO CAPS Oral Take 25 mg by mouth 3 (three) times daily as needed.      Marland Kitchen LISINOPRIL-HYDROCHLOROTHIAZIDE 20-25 MG PO TABS Oral Take 1 tablet by mouth daily.      Marland Kitchen METOPROLOL TARTRATE 100 MG PO TABS Oral Take 100 mg by  mouth 2 (two) times daily.      Marland Kitchen MIRTAZAPINE 15 MG PO TABS Oral Take 7.5 mg by mouth at bedtime.      . MULTI-VITAMIN PO Oral Take by mouth daily.      Marland Kitchen NITROGLYCERIN 0.4 MG SL SUBL Sublingual Place 0.4 mg under the tongue every 5 (five) minutes as needed.      . TRIAMCINOLONE ACETONIDE 0.1 % EX CREA  Apply to area on neck two times a day as needed (do not use more than 2 weeks straight)       BP 147/83  Pulse 70  Temp(Src) 99.4 F (37.4 C) (Oral)  Resp 20  SpO2 98%  Physical Exam  Nursing note and vitals reviewed. Constitutional: He is oriented to person, place, and time. He appears well-developed and well-nourished. No distress.       obese  HENT:  Head: Normocephalic and atraumatic.  Right Ear: External ear  normal.  Left Ear: External ear normal.  Nose: Nose normal.  Mouth/Throat: Oropharynx is clear and moist. No oropharyngeal exudate.  Eyes: Conjunctivae are normal. Pupils are equal, round, and reactive to light. No scleral icterus.  Neck: Neck supple. No JVD present. No thyromegaly present.  Cardiovascular: Normal rate, regular rhythm and normal heart sounds.   Pulmonary/Chest: Effort normal and breath sounds normal. No respiratory distress. He has no wheezes. He has no rales.  Abdominal: Soft. He exhibits no distension.       No CVT  Lymphadenopathy:    He has no cervical adenopathy.  Neurological: He is alert and oriented to person, place, and time.  Psychiatric: He has a normal mood and affect. His behavior is normal.    ED Course  Procedures (including critical care time)  Labs Reviewed  POCT URINALYSIS DIP (DEVICE) - Abnormal; Notable for the following:    Glucose, UA >=1000 (*)    Hgb urine dipstick SMALL (*)    All other components within normal limits  POCT I-STAT, CHEM 8 - Abnormal; Notable for the following:    Glucose, Bld 266 (*)    All other components within normal limits  LAB REPORT - SCANNED   No results found.   1. Diabetes mellitus   2. Hematuria, microscopic       MDM  Diabetic with glucosuria, microscopic hematuria (appears resolving hematuria), no gross proteinuria, moderate hyperglycemia and normotensive here. Creatinine stable compared with reccords. Not taking diabetes medications. Already on an ACE.  Discussed with patient and started on small dose of glyburide 2.5mg  daily. Currently stable. My impression is that patient can have an outpatient work up. He will call to make an appt. At [facility] for diabetes follow up. And will f/u with urology as scheduled. Otherwise will go to the ED if worsening or new symptoms.         Sharin Grave, MD 10/16/11 2235

## 2011-10-21 ENCOUNTER — Other Ambulatory Visit: Payer: Self-pay

## 2012-07-02 ENCOUNTER — Emergency Department (INDEPENDENT_AMBULATORY_CARE_PROVIDER_SITE_OTHER)
Admission: EM | Admit: 2012-07-02 | Discharge: 2012-07-02 | Disposition: A | Discharge status: Home / Self Care | Payer: Self-pay | Source: Home / Self Care | Attending: Family Medicine | Admitting: Family Medicine

## 2012-07-02 ENCOUNTER — Encounter (HOSPITAL_COMMUNITY): Payer: Self-pay

## 2012-07-02 DIAGNOSIS — I2510 Atherosclerotic heart disease of native coronary artery without angina pectoris: Secondary | ICD-10-CM

## 2012-07-02 DIAGNOSIS — I2589 Other forms of chronic ischemic heart disease: Secondary | ICD-10-CM

## 2012-07-02 DIAGNOSIS — M109 Gout, unspecified: Secondary | ICD-10-CM

## 2012-07-02 DIAGNOSIS — I10 Essential (primary) hypertension: Secondary | ICD-10-CM

## 2012-07-02 DIAGNOSIS — E119 Type 2 diabetes mellitus without complications: Secondary | ICD-10-CM

## 2012-07-02 HISTORY — DX: Malignant (primary) neoplasm, unspecified: C80.1

## 2012-07-02 LAB — CBC
Hemoglobin: 14.2 g/dL (ref 13.0–17.0)
Platelets: 246 10*3/uL (ref 150–400)
RBC: 4.51 MIL/uL (ref 4.22–5.81)
WBC: 7.4 10*3/uL (ref 4.0–10.5)

## 2012-07-02 LAB — COMPREHENSIVE METABOLIC PANEL
ALT: 127 U/L — ABNORMAL HIGH (ref 0–53)
AST: 85 U/L — ABNORMAL HIGH (ref 0–37)
Alkaline Phosphatase: 89 U/L (ref 39–117)
CO2: 28 mEq/L (ref 19–32)
GFR calc Af Amer: >90 mL/min (ref >90)
GFR calc non Af Amer: >90 mL/min (ref >90)
Glucose, Bld: 134 mg/dL — ABNORMAL HIGH (ref 70–99)
Potassium: 4.5 mEq/L (ref 3.5–5.1)
Sodium: 143 mEq/L (ref 135–145)

## 2012-07-02 LAB — URIC ACID: Uric Acid, Serum: 7.4 mg/dL (ref 4.0–7.8)

## 2012-07-02 LAB — LIPID PANEL
LDL Cholesterol: UNDETERMINED mg/dL (ref 0–99)
Total CHOL/HDL Ratio: 8.6 RATIO

## 2012-07-02 LAB — HEMOGLOBIN A1C: Hgb A1c MFr Bld: 8.6 % — ABNORMAL HIGH (ref <5.7)

## 2012-07-02 LAB — TSH: TSH: 2.131 u[IU]/mL (ref 0.350–4.500)

## 2012-07-02 MED ORDER — GLYBURIDE 2.5 MG PO TABS: 2.5000 mg | ORAL_TABLET | Freq: Every day | ORAL | Status: DC

## 2012-07-02 MED ORDER — INFLUENZA VIRUS VACC SPLIT PF IM SUSP
0.5000 mL | Freq: Once | INTRAMUSCULAR | Status: AC
  Administered 2012-07-02: 0.5 mL via INTRAMUSCULAR

## 2012-07-02 MED ORDER — CLOPIDOGREL BISULFATE 75 MG PO TABS: 75.0000 mg | ORAL_TABLET | Freq: Every day | ORAL | Status: AC

## 2012-07-02 MED ORDER — ESCITALOPRAM OXALATE 20 MG PO TABS: 20.0000 mg | ORAL_TABLET | Freq: Every day | ORAL | Status: DC

## 2012-07-02 MED ORDER — INDOMETHACIN 50 MG PO CAPS: ORAL_CAPSULE | ORAL | Status: DC

## 2012-07-02 MED ORDER — NITROGLYCERIN 0.4 MG SL SUBL: 0.4000 mg | SUBLINGUAL_TABLET | SUBLINGUAL | Status: AC | PRN

## 2012-07-02 MED ORDER — METOPROLOL TARTRATE 100 MG PO TABS: 100.0000 mg | ORAL_TABLET | Freq: Two times a day (BID) | ORAL | Status: AC

## 2012-07-02 MED ORDER — LISINOPRIL-HYDROCHLOROTHIAZIDE 20-25 MG PO TABS: 1.0000 | ORAL_TABLET | Freq: Every day | ORAL | Status: AC

## 2012-07-02 MED ORDER — MIRTAZAPINE 15 MG PO TABS: 7.5000 mg | ORAL_TABLET | Freq: Every day | ORAL | Status: DC

## 2012-07-02 MED ORDER — HYDROXYZINE PAMOATE 25 MG PO CAPS: 25.0000 mg | ORAL_CAPSULE | Freq: Three times a day (TID) | ORAL | Status: DC | PRN

## 2012-07-02 MED ORDER — CLONIDINE HCL 0.1 MG PO TABS: 0.1000 mg | ORAL_TABLET | Freq: Two times a day (BID) | ORAL | Status: DC

## 2012-07-02 NOTE — ED Notes (Signed)
Patient has a history of gout,hypertension, DM, high cholestorol, heart attack

## 2012-07-02 NOTE — ED Provider Notes (Signed)
History    CSN: [id number]  Arrival date & time 07/02/12  1111   First MD Initiated Contact with Patient 07/02/12 1222     Chief Complaint  Patient presents with  . Medication Refill  . Gout  . Otalgia   HPI Pt says that he has had 3 days of gout pain in right 1st toe.  Pt says that he has had acute gout attacks before.  He said that he took a short course of the medication that seemed to help his symptoms but he cannot remember the name of the medication.  He did not bring his medical records from [facility].  The patient reports that he has coronary artery disease and he follows with the cardiologist.  He reports that he normally takes cholesterol medicine but he cannot remember the name of the medicine that he's taken.  He reports that it was recently changed by his cardiologist.  He has not seen his cardiologist in over one year.  The patient reports that he's had no chest pain or shortness of breath.  The patient reports that he has type 2 diabetes mellitus that was diagnosed approximately 6 months ago.  He was started on glipizide and has been taking that and reports no further symptoms.  He's not able to test his blood sugar regularly.  He reports no symptoms of hypoglycemia.   Past Medical History  Diagnosis Date  . HTN (hypertension)   . CAD (coronary artery disease)     s/p MI in [place] cath 2006, stent RCA, 50% circ, ostial PDA 80%  . Hyperlipidemia   . Cardiomyopathy     EF 40%  . Cancer     Past Surgical History  Procedure Date  . Coronary angioplasty with stent placement   . Cardiac surgery     Family History  Problem Relation Age of Onset  . Coronary artery disease Mother     MI inher late 61s or early 21s  . Breast cancer Other     aunts  . Colon cancer Neg Hx   . Prostate cancer Neg Hx     History  Substance Use Topics  . Smoking status: Former Games developer  . Smokeless tobacco: Not on file  . Alcohol Use: No    Review of Systems  Genitourinary: Positive for  frequency.  Musculoskeletal: Positive for joint swelling.       Swelling of the right first toe MCP joint-severely painful with palpation or attempted movement  All other systems reviewed and are negative.    Allergies  Codeine sulfate and Penicillins  Home Medications   Current Outpatient Rx  Name  Route  Sig  Dispense  Refill  . ACETAMINOPHEN 500 MG PO TABS   Oral   Take 500 mg by mouth every 6 (six) hours as needed.         . GLYBURIDE 2.5 MG PO TABS   Oral   Take 2.5 mg by mouth daily with breakfast.         . ASPIRIN 325 MG PO TBEC   Oral   Take 325 mg by mouth daily.           . ATORVASTATIN CALCIUM 20 MG PO TABS   Oral   Take 1 tablet (20 mg total) by mouth daily.   30 tablet   11     May use atorvastatin if less expensive for pt   . CLONIDINE HCL 0.1 MG PO TABS   Oral   Take  0.1 mg by mouth 2 (two) times daily.           Marland Kitchen CLOPIDOGREL BISULFATE 75 MG PO TABS   Oral   Take 1 tablet (75 mg total) by mouth daily.   30 tablet   11   . ESCITALOPRAM OXALATE 10 MG PO TABS   Oral   Take 30 mg by mouth daily.           Marland Kitchen GLIPIZIDE ER 2.5 MG PO TB24   Oral   Take 1 tablet (2.5 mg total) by mouth daily.   30 tablet   0   . HYDROXYZINE PAMOATE 25 MG PO CAPS   Oral   Take 25 mg by mouth 3 (three) times daily as needed.           Marland Kitchen LISINOPRIL-HYDROCHLOROTHIAZIDE 20-25 MG PO TABS   Oral   Take 1 tablet by mouth daily.           Marland Kitchen METOPROLOL TARTRATE 100 MG PO TABS   Oral   Take 100 mg by mouth 2 (two) times daily.           Marland Kitchen MIRTAZAPINE 15 MG PO TABS   Oral   Take 7.5 mg by mouth at bedtime.           . MULTI-VITAMIN PO   Oral   Take by mouth daily.           Marland Kitchen NITROGLYCERIN 0.4 MG SL SUBL   Sublingual   Place 0.4 mg under the tongue every 5 (five) minutes as needed.           . TRIAMCINOLONE ACETONIDE 0.1 % EX CREA      Apply to area on neck two times a day as needed (do not use more than 2 weeks straight)            BP 148/91  Pulse 71  Temp 98.4 F (36.9 C) (Oral)  Resp 16  SpO2 100%  Physical Exam  Nursing note and vitals reviewed. Constitutional: He is oriented to person, place, and time. He appears well-developed and well-nourished. No distress.  HENT:  Head: Normocephalic and atraumatic.  Eyes: EOM are normal. Pupils are equal, round, and reactive to light.  Neck: Normal range of motion. Neck supple. No JVD present. No thyromegaly present.  Cardiovascular: Normal rate, regular rhythm and normal heart sounds.   Pulmonary/Chest: Effort normal and breath sounds normal. No respiratory distress. He has no wheezes.  Abdominal: Soft. Bowel sounds are normal. He exhibits no distension and no mass. There is no tenderness. There is no rebound and no guarding.  Musculoskeletal: He exhibits edema and tenderness.       swollen tender inflamed right first MCP joint  Lymphadenopathy:    He has no cervical adenopathy.  Neurological: He is alert and oriented to person, place, and time.  Skin: Skin is warm and dry.  Psychiatric: He has a normal mood and affect. His behavior is normal. Judgment and thought content normal.   ED Course  Procedures (including critical care time)  Labs Reviewed - No data to display No results found.  No diagnosis found.  MDM  IMPRESSION  CAD  Acute Gout Arthritis  Hyperlipidemia  Type 2 Diabetes Mellitus  Hypertension  RECOMMENDATIONS / PLAN Flu vaccine provided today Refilled all meds today  Checked labs today including a uric acid level Indocin 50 mg po TID prn severe gout pain Requested patient sign a release so we can obtain  medical records  FOLLOW UP 3 months   The patient was given clear instructions to go to ER or return to medical center if symptoms don't improve, worsen or new problems develop.  The patient verbalized understanding.  The patient was told to call to get lab results if they haven't heard anything in the next week.             Cleora Fleet, MD 07/02/12 1455

## 2012-07-03 NOTE — Progress Notes (Signed)
Quick Note:  Please notify patient that his diabetes is not controlled as evidenced by an A1c of 8.6%. His goal A1c is less than 7%. Also his cholesterol panel revealed that his lipids are out of control as well. Please have the patient call his cardiologist to find out what cholesterol medicine he is supposed to be taking. Have him followup with his cardiologist. Recommend low-fat low-cholesterol diet. Get back on his cholesterol medication. Also please notify the patient that his liver function enzymes came back abnormal. I don't know if this is something that is new because we don't have any of his old labs to compare. Please have the patient to [facility] and bring them to our office as soon as possible so we can review them. I would like for the patient to have his labs tested again in 3 months.   Rodney Langton, MD, CDE, FAAFP [facility] [place], [place]   ______

## 2012-07-08 ENCOUNTER — Telehealth (HOSPITAL_COMMUNITY): Payer: Self-pay

## 2012-07-08 NOTE — Telephone Encounter (Signed)
Message copied by Lestine Mount on Wed Jul 08, 2012 10:22 AM ------      Message from: Cleora Fleet      Created: Fri Jul 03, 2012  9:46 AM       Please notify patient that his diabetes is not controlled as evidenced by an A1c of 8.6%.  His goal A1c is less than 7%.  Also his cholesterol panel revealed that his lipids are out of control as well.  Please have the patient call his cardiologist to find out what cholesterol medicine he is supposed to be taking.  Have him followup with his cardiologist.  Recommend low-fat low-cholesterol diet.  Get back on his cholesterol medication.  Also please notify the patient that his liver function enzymes came back abnormal.  I don't know if this is something that is new because we don't have any of his old labs to compare.  Please have the patient to get [facility] and bring them to our office as soon as possible so we can review them.  I would like for the patient to have his labs tested again in 3 months.                  Rodney Langton, MD, CDE, FAAFP      [facility]      [facility]      [place], [place]

## 2012-12-15 ENCOUNTER — Ambulatory Visit: Payer: Self-pay | Admitting: Internal Medicine

## 2013-01-14 ENCOUNTER — Ambulatory Visit (HOSPITAL_COMMUNITY): Payer: Self-pay | Admitting: Psychiatry

## 2013-01-27 ENCOUNTER — Ambulatory Visit (HOSPITAL_COMMUNITY): Payer: Self-pay | Admitting: Psychiatry

## 2013-02-12 ENCOUNTER — Ambulatory Visit (HOSPITAL_COMMUNITY): Payer: Self-pay | Admitting: Psychiatry

## 2013-09-08 ENCOUNTER — Emergency Department (HOSPITAL_COMMUNITY)
Admission: EM | Admit: 2013-09-08 | Discharge: 2013-09-08 | Disposition: A | Discharge status: Home / Self Care | Payer: Self-pay | Attending: Internal Medicine | Admitting: Internal Medicine

## 2013-09-08 ENCOUNTER — Encounter (HOSPITAL_COMMUNITY): Payer: Self-pay | Admitting: Emergency Medicine

## 2013-09-08 DIAGNOSIS — Y9389 Activity, other specified: Secondary | ICD-10-CM | POA: Insufficient documentation

## 2013-09-08 DIAGNOSIS — IMO0002 Reserved for concepts with insufficient information to code with codable children: Secondary | ICD-10-CM | POA: Insufficient documentation

## 2013-09-08 DIAGNOSIS — Z859 Personal history of malignant neoplasm, unspecified: Secondary | ICD-10-CM | POA: Insufficient documentation

## 2013-09-08 DIAGNOSIS — Y92410 Unspecified street and highway as the place of occurrence of the external cause: Secondary | ICD-10-CM | POA: Insufficient documentation

## 2013-09-08 DIAGNOSIS — I10 Essential (primary) hypertension: Secondary | ICD-10-CM | POA: Insufficient documentation

## 2013-09-08 DIAGNOSIS — S99919A Unspecified injury of unspecified ankle, initial encounter: Secondary | ICD-10-CM

## 2013-09-08 DIAGNOSIS — Z79899 Other long term (current) drug therapy: Secondary | ICD-10-CM | POA: Insufficient documentation

## 2013-09-08 DIAGNOSIS — Z7982 Long term (current) use of aspirin: Secondary | ICD-10-CM | POA: Insufficient documentation

## 2013-09-08 DIAGNOSIS — Z9861 Coronary angioplasty status: Secondary | ICD-10-CM | POA: Insufficient documentation

## 2013-09-08 DIAGNOSIS — Z7902 Long term (current) use of antithrombotics/antiplatelets: Secondary | ICD-10-CM | POA: Insufficient documentation

## 2013-09-08 DIAGNOSIS — Z87891 Personal history of nicotine dependence: Secondary | ICD-10-CM | POA: Insufficient documentation

## 2013-09-08 DIAGNOSIS — Z880 Allergy status to penicillin: Secondary | ICD-10-CM | POA: Insufficient documentation

## 2013-09-08 DIAGNOSIS — S8990XA Unspecified injury of unspecified lower leg, initial encounter: Secondary | ICD-10-CM | POA: Insufficient documentation

## 2013-09-08 DIAGNOSIS — Z9889 Other specified postprocedural states: Secondary | ICD-10-CM | POA: Insufficient documentation

## 2013-09-08 DIAGNOSIS — I2510 Atherosclerotic heart disease of native coronary artery without angina pectoris: Secondary | ICD-10-CM | POA: Insufficient documentation

## 2013-09-08 DIAGNOSIS — E119 Type 2 diabetes mellitus without complications: Secondary | ICD-10-CM | POA: Insufficient documentation

## 2013-09-08 DIAGNOSIS — F3289 Other specified depressive episodes: Secondary | ICD-10-CM | POA: Insufficient documentation

## 2013-09-08 DIAGNOSIS — F329 Major depressive disorder, single episode, unspecified: Secondary | ICD-10-CM | POA: Insufficient documentation

## 2013-09-08 DIAGNOSIS — S99929A Unspecified injury of unspecified foot, initial encounter: Secondary | ICD-10-CM

## 2013-09-08 HISTORY — DX: Major depressive disorder, single episode, unspecified: F32.9

## 2013-09-08 HISTORY — DX: Type 2 diabetes mellitus without complications: E11.9

## 2013-09-08 HISTORY — DX: Depression, unspecified: F32.A

## 2013-09-08 MED ORDER — METHOCARBAMOL 500 MG PO TABS: 500.0000 mg | ORAL_TABLET | Freq: Two times a day (BID) | ORAL | Status: DC

## 2013-09-08 MED ORDER — TRAMADOL HCL 50 MG PO TABS: 50.0000 mg | ORAL_TABLET | Freq: Four times a day (QID) | ORAL | Status: AC | PRN
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
## 2013-09-10 NOTE — ED Provider Notes (Signed)
Medical screening examination/treatment/procedure(s) were performed by non-physician practitioner and as supervising physician I was immediately available for consultation/collaboration.   Houston Siren III, MD 09/10/13 [phone]

## 2013-11-27 ENCOUNTER — Emergency Department (HOSPITAL_COMMUNITY): Payer: Medicare HMO

## 2013-11-27 ENCOUNTER — Emergency Department (HOSPITAL_COMMUNITY)
Admission: EM | Admit: 2013-11-27 | Discharge: 2013-11-27 | Disposition: A | Discharge status: Home / Self Care | Payer: Self-pay | Attending: Emergency Medicine | Admitting: Emergency Medicine

## 2013-11-27 ENCOUNTER — Encounter (HOSPITAL_COMMUNITY): Payer: Self-pay | Admitting: Emergency Medicine

## 2013-11-27 ENCOUNTER — Emergency Department (HOSPITAL_COMMUNITY): Payer: Self-pay

## 2013-11-27 DIAGNOSIS — E119 Type 2 diabetes mellitus without complications: Secondary | ICD-10-CM | POA: Insufficient documentation

## 2013-11-27 DIAGNOSIS — Z87891 Personal history of nicotine dependence: Secondary | ICD-10-CM | POA: Insufficient documentation

## 2013-11-27 DIAGNOSIS — K5900 Constipation, unspecified: Secondary | ICD-10-CM | POA: Insufficient documentation

## 2013-11-27 DIAGNOSIS — R079 Chest pain, unspecified: Secondary | ICD-10-CM | POA: Insufficient documentation

## 2013-11-27 DIAGNOSIS — I2510 Atherosclerotic heart disease of native coronary artery without angina pectoris: Secondary | ICD-10-CM | POA: Insufficient documentation

## 2013-11-27 DIAGNOSIS — Z880 Allergy status to penicillin: Secondary | ICD-10-CM | POA: Insufficient documentation

## 2013-11-27 DIAGNOSIS — F3289 Other specified depressive episodes: Secondary | ICD-10-CM | POA: Insufficient documentation

## 2013-11-27 DIAGNOSIS — Z7901 Long term (current) use of anticoagulants: Secondary | ICD-10-CM | POA: Insufficient documentation

## 2013-11-27 DIAGNOSIS — Z79899 Other long term (current) drug therapy: Secondary | ICD-10-CM | POA: Insufficient documentation

## 2013-11-27 DIAGNOSIS — Z7982 Long term (current) use of aspirin: Secondary | ICD-10-CM | POA: Insufficient documentation

## 2013-11-27 DIAGNOSIS — Z8509 Personal history of malignant neoplasm of other digestive organs: Secondary | ICD-10-CM | POA: Insufficient documentation

## 2013-11-27 DIAGNOSIS — K219 Gastro-esophageal reflux disease without esophagitis: Secondary | ICD-10-CM | POA: Insufficient documentation

## 2013-11-27 DIAGNOSIS — Z9889 Other specified postprocedural states: Secondary | ICD-10-CM | POA: Insufficient documentation

## 2013-11-27 DIAGNOSIS — I10 Essential (primary) hypertension: Secondary | ICD-10-CM | POA: Insufficient documentation

## 2013-11-27 DIAGNOSIS — Z9861 Coronary angioplasty status: Secondary | ICD-10-CM | POA: Insufficient documentation

## 2013-11-27 DIAGNOSIS — F329 Major depressive disorder, single episode, unspecified: Secondary | ICD-10-CM | POA: Insufficient documentation

## 2013-11-27 LAB — CBC
HCT: 40.5 % (ref 39.0–52.0)
HEMOGLOBIN: 14 g/dL (ref 13.0–17.0)
MCH: 31 pg (ref 26.0–34.0)
MCHC: 34.6 g/dL (ref 30.0–36.0)
MCV: 89.6 fL (ref 78.0–100.0)
Platelets: 253 10*3/uL (ref 150–400)
RBC: 4.52 MIL/uL (ref 4.22–5.81)
RDW: 13.2 % (ref 11.5–15.5)
WBC: 4.1 10*3/uL (ref 4.0–10.5)

## 2013-11-27 LAB — BASIC METABOLIC PANEL
BUN: 13 mg/dL (ref 6–23)
CALCIUM: 9.9 mg/dL (ref 8.4–10.5)
CO2: 29 meq/L (ref 19–32)
Chloride: 95 mEq/L — ABNORMAL LOW (ref 96–112)
Creatinine, Ser: 0.94 mg/dL (ref 0.50–1.35)
GFR calc Af Amer: >90 mL/min (ref >90)
GFR calc non Af Amer: >90 mL/min (ref >90)
GLUCOSE: 263 mg/dL — AB (ref 70–99)
POTASSIUM: 3.8 meq/L (ref 3.7–5.3)
SODIUM: 136 meq/L — AB (ref 137–147)

## 2013-11-27 LAB — I-STAT TROPONIN, ED: TROPONIN I, POC: 0.02 ng/mL (ref 0.00–0.08)

## 2013-11-27 LAB — PRO B NATRIURETIC PEPTIDE: Pro B Natriuretic peptide (BNP): 44.5 pg/mL (ref 0.0–100.0)

## 2013-11-27 MED ORDER — SUCRALFATE 1 G PO TABS: 1.0000 g | ORAL_TABLET | Freq: Two times a day (BID) | ORAL | Status: AC

## 2013-11-27 MED ORDER — PANTOPRAZOLE SODIUM 40 MG IV SOLR
40.0000 mg | Freq: Once | INTRAVENOUS | Status: AC
  Administered 2013-11-27: 40 mg via INTRAVENOUS
  Filled 2013-11-27: qty 40

## 2013-11-27 MED ORDER — PEG 3350-KCL-NABCB-NACL-NASULF 236 G PO SOLR: 4000.0000 mL | Freq: Once | ORAL | Status: AC

## 2013-11-27 MED ORDER — FLEET ENEMA 7-19 GM/118ML RE ENEM: 1.0000 | ENEMA | Freq: Once | RECTAL | Status: AC

## 2013-11-27 MED ORDER — SUCRALFATE 1 GM/10ML PO SUSP
1.0000 g | Freq: Three times a day (TID) | ORAL | Status: DC
  Administered 2013-11-27: 1 g via ORAL
  Filled 2013-11-27 (×4): qty 10

## 2013-11-27 MED ORDER — MORPHINE SULFATE 4 MG/ML IJ SOLN: 4.0000 mg | Freq: Once | INTRAMUSCULAR | Status: DC | PRN

## 2013-11-27 MED ORDER — ONDANSETRON HCL 4 MG/2ML IJ SOLN
4.0000 mg | Freq: Once | INTRAMUSCULAR | Status: DC
  Filled 2013-11-27: qty 2

## 2013-11-27 MED ORDER — ASPIRIN 81 MG PO CHEW
324.0000 mg | CHEWABLE_TABLET | Freq: Once | ORAL | Status: AC
Start: 2013-11-27 — End: 2013-11-27
  Administered 2013-11-27: 324 mg via ORAL
  Filled 2013-11-27: qty 4

## 2013-11-27 NOTE — ED Notes (Signed)
Pt reports intermittent central chest "tightness" x 1 month with radiation to left arm and left jaw. Reports mild dyspnea with exertion and intermittent diaphoresis, nausea and lightheadedness. Reports pain now 4/10. NAD. Hx: 2 MI's

## 2013-11-27 NOTE — Discharge Instructions (Signed)
Bloating Bloating is the feeling of fullness in your belly. You may feel as though your pants are too tight. Often the cause of bloating is overeating, retaining fluids, or having gas in your bowel. It is also caused by swallowing air and eating foods that cause gas. Irritable bowel syndrome is one of the most common causes of bloating. Constipation is also a common cause. Sometimes more serious problems can cause bloating. SYMPTOMS  Usually there is a feeling of fullness, as though your abdomen is bulged out. There may be mild discomfort.  DIAGNOSIS  Usually no particular testing is necessary for most bloating. If the condition persists and seems to become worse, your caregiver may do additional testing.  TREATMENT   There is no direct treatment for bloating.  Do not put gas into the bowel. Avoid chewing gum and sucking on candy. These tend to make you swallow air. Swallowing air can also be a nervous habit. Try to avoid this.  Avoiding high residue diets will help. Eat foods with soluble fibers (examples include root vegetables, apples, or barley) and substitute dairy products with soy and rice products. This helps irritable bowel syndrome.  If constipation is the cause, then a high residue diet with more fiber will help.  Avoid carbonated beverages.  Over-the-counter preparations are available that help reduce gas. Your pharmacist can help you with this. SEEK MEDICAL CARE IF:   Bloating continues and seems to be getting worse.  You notice a weight gain.  You have a weight loss but the bloating is getting worse.  You have changes in your bowel habits or develop nausea or vomiting. SEEK IMMEDIATE MEDICAL CARE IF:   You develop shortness of breath or swelling in your legs.  You have an increase in abdominal pain or develop chest pain. Document Released: 03/27/2006 Document Revised: 08/19/2011 Document Reviewed: 05/15/2007 [facility] Patient Information 2015 [place], [place]. This  information is not intended to replace advice given to you by your health care provider. Make sure you discuss any questions you have with your health care provider.  Constipation Constipation is when a person has fewer than three bowel movements a week, has difficulty having a bowel movement, or has stools that are dry, hard, or larger than normal. As people grow older, constipation is more common. If you try to fix constipation with medicines that make you have a bowel movement (laxatives), the problem may get worse. Long-term laxative use may cause the muscles of the colon to become weak. A low-fiber diet, not taking in enough fluids, and taking certain medicines may make constipation worse.  CAUSES   Certain medicines, such as antidepressants, pain medicine, iron supplements, antacids, and water pills.   Certain diseases, such as diabetes, irritable bowel syndrome (IBS), thyroid disease, or depression.   Not drinking enough water.   Not eating enough fiber-rich foods.   Stress or travel.   Lack of physical activity or exercise.   Ignoring the urge to have a bowel movement.   Using laxatives too much.  SIGNS AND SYMPTOMS   Having fewer than three bowel movements a week.   Straining to have a bowel movement.   Having stools that are hard, dry, or larger than normal.   Feeling full or bloated.   Pain in the lower abdomen.   Not feeling relief after having a bowel movement.  DIAGNOSIS  Your health care provider will take a medical history and perform a physical exam. Further testing may be done for severe constipation.  Some tests may include:  A barium enema X-ray to examine your rectum, colon, and, sometimes, your small intestine.   A sigmoidoscopy to examine your lower colon.   A colonoscopy to examine your entire colon. TREATMENT  Treatment will depend on the severity of your constipation and what is causing it. Some dietary treatments include drinking  more fluids and eating more fiber-rich foods. Lifestyle treatments may include regular exercise. If these diet and lifestyle recommendations do not help, your health care provider may recommend taking over-the-counter laxative medicines to help you have bowel movements. Prescription medicines may be prescribed if over-the-counter medicines do not work.  HOME CARE INSTRUCTIONS   Eat foods that have a lot of fiber, such as fruits, vegetables, whole grains, and beans.  Limit foods high in fat and processed sugars, such as french fries, hamburgers, cookies, candies, and soda.   A fiber supplement may be added to your diet if you cannot get enough fiber from foods.   Drink enough fluids to keep your urine clear or pale yellow.   Exercise regularly or as directed by your health care provider.   Go to the restroom when you have the urge to go. Do not hold it.   Only take over-the-counter or prescription medicines as directed by your health care provider. Do not take other medicines for constipation without talking to your health care provider first.  [place] IF:   You have bright red blood in your stool.   Your constipation lasts for more than 4 days or gets worse.   You have abdominal or rectal pain.   You have thin, pencil-like stools.   You have unexplained weight loss. MAKE SURE YOU:   Understand these instructions.  Will watch your condition.  Will get help right away if you are not doing well or get worse. Document Released: 02/23/2004 Document Revised: 06/01/2013 Document Reviewed: 03/08/2013 [facility] Patient Information 2015 [place], [place]. This information is not intended to replace advice given to you by your health care provider. Make sure you discuss any questions you have with your health care provider.  Gastroesophageal Reflux Disease, Adult Gastroesophageal reflux disease (GERD) happens when acid from your stomach goes into your food pipe  (esophagus). The acid can cause a burning feeling in your chest. Over time, the acid can make small holes (ulcers) in your food pipe.  HOME CARE  Ask your doctor for advice about:  Losing weight.  Quitting smoking.  Alcohol use.  Avoid foods and drinks that make your problems worse. You may want to avoid:  Caffeine and alcohol.  Chocolate.  Mints.  Garlic and onions.  Spicy foods.  Citrus fruits, such as oranges, lemons, or limes.  Foods that contain tomato, such as sauce, chili, salsa, and pizza.  Fried and fatty foods.  Avoid lying down for 3 hours before you go to bed or before you take a nap.  Eat small meals often, instead of large meals.  Wear loose-fitting clothing. Do not wear anything tight around your waist.  Raise (elevate) the head of your bed 6 to 8 inches with wood blocks. Using extra pillows does not help.  Only take medicines as told by your doctor.  Do not take aspirin or ibuprofen. GET HELP RIGHT AWAY IF:   You have pain in your arms, neck, jaw, teeth, or back.  Your pain gets worse or changes.  You feel sick to your stomach (nauseous), throw up (vomit), or sweat (diaphoresis).  You feel  short of breath, or you pass out (faint).  Your throw up is green, yellow, black, or looks like coffee grounds or blood.  Your poop (stool) is red, bloody, or black. MAKE SURE YOU:   Understand these instructions.  Will watch your condition.  Will get help right away if you are not doing well or get worse. Document Released: 11/13/2007 Document Revised: 08/19/2011 Document Reviewed: 12/14/2010 [facility] Patient Information 2015 [place], [place]. This information is not intended to replace advice given to you by your health care provider. Make sure you discuss any questions you have with your health care provider.

## 2013-11-27 NOTE — ED Notes (Signed)
ED PA at bedside

## 2013-11-27 NOTE — ED Provider Notes (Signed)
CSN: [id number]     Arrival date & time 11/27/13  1135 History   First MD Initiated Contact with Patient 11/27/13 1147     Chief Complaint  Patient presents with  . Chest Pain     (Consider location/radiation/quality/duration/timing/severity/associated sxs/prior Treatment) HPI  Patient presents to the emergency department with complaints of constipation and epigastric pain. He reports that he has not had a really good bowel movement in at least a month. He reports trying stool softeners but without any relief. The triage note says that the patient has been having central chest tightness with radiation to the left arm and left jaw, the patient denies having any chest pain or any complaints of radiation to left arm and left jaw. Triage note also says that he's been having some shortness of breath with exertion, diaphoresis with nausea and lightheadedness. I asked the patient about it and he denied. I told him that he reported this symptoms to the triage nurse and he denies that he said he had any of of those symptoms. I asked the patient multiple times in multiple ways and he continues to deny. He admits to having acid reflux which he says is worse when he is constipated. He requests medicine to help "clean him out".  He does have a past medical history of hypertension, coronary artery disease with stent placement, cardiomyopathy, cancer, diabetes and depression.  Past Medical History  Diagnosis Date  . HTN (hypertension)   . CAD (coronary artery disease)     s/p MI in [place] cath 2006, stent RCA, 50% circ, ostial PDA 80%  . Hyperlipidemia   . Cardiomyopathy     EF 40%  . Cancer   . Diabetes mellitus without complication   . Depression    Past Surgical History  Procedure Laterality Date  . Coronary angioplasty with stent placement    . Cardiac surgery     Family History  Problem Relation Age of Onset  . Coronary artery disease Mother     MI inher late 59s or early 22s  . Breast cancer  Other     aunts  . Colon cancer Neg Hx   . Prostate cancer Neg Hx    History  Substance Use Topics  . Smoking status: Former Smoker    Quit date: 06/29/1993  . Smokeless tobacco: Not on file  . Alcohol Use: No    Review of Systems  Review of Systems  Gen: no weight loss, fevers, chills, night sweats  Eyes: no discharge or drainage, no occular pain or visual changes  Nose: no epistaxis or rhinorrhea  Mouth: no dental pain, no sore throat  Neck: no neck pain  Lungs:No wheezing, coughing or hemoptysis CV: no chest pain, palpitations, dependent edema or orthopnea  Abd: + constipation and epigastric pain GU: no dysuria or gross hematuria  MSK:  No muscle weakness or pain Neuro: no headache, no focal neurologic deficits  Skin: no rash or wounds Psyche: no complaints     Allergies  Codeine sulfate and Penicillins  Home Medications   Prior to Admission medications   Medication Sig Start Date End Date Taking? Authorizing Provider  aspirin 325 MG EC tablet Take 325 mg by mouth daily.     Yes Historical Provider, MD  cloNIDine (CATAPRES) 0.1 MG tablet Take 0.1 mg by mouth daily.   Yes Historical Provider, MD  clopidogrel (PLAVIX) 75 MG tablet Take 1 tablet (75 mg total) by mouth daily. 07/02/12  Yes Clanford Marisa Hua, MD  escitalopram (LEXAPRO) 20 MG tablet Take 30 mg by mouth at bedtime.   Yes Historical Provider, MD  glyBURIDE-metformin (GLUCOVANCE) 2.5-500 MG per tablet Take 1 tablet by mouth daily with breakfast.  11/22/13  Yes Historical Provider, MD  lisinopril-hydrochlorothiazide (PRINZIDE,ZESTORETIC) 20-25 MG per tablet Take 1 tablet by mouth daily. 07/02/12  Yes Clanford Marisa Hua, MD  metoprolol (LOPRESSOR) 100 MG tablet Take 1 tablet (100 mg total) by mouth 2 (two) times daily. 07/02/12  Yes Clanford Marisa Hua, MD  Multiple Vitamin (MULTI-VITAMIN PO) Take 1 tablet by mouth daily.    Yes Historical Provider, MD  nitroGLYCERIN (NITROSTAT) 0.4 MG SL tablet Place 1 tablet  (0.4 mg total) under the tongue every 5 (five) minutes as needed. 07/02/12  Yes Clanford Marisa Hua, MD  traMADol (ULTRAM) 50 MG tablet Take 1 tablet (50 mg total) by mouth every 6 (six) hours as needed. 09/08/13  Yes Domenic Moras, PA-C  polyethylene glycol (GOLYTELY) 236 G solution Take 4,000 mLs by mouth once. 11/27/13   Tiffany Marilu Favre, PA-C  sodium phosphate (FLEET) 7-19 GM/118ML ENEM Place 133 mLs (1 enema total) rectally once. 11/27/13   Tiffany Marilu Favre, PA-C  sucralfate (CARAFATE) 1 G tablet Take 1 tablet (1 g total) by mouth 2 (two) times daily. 11/27/13   Tiffany Marilu Favre, PA-C   BP 107/76  Pulse 66  Temp(Src) 98.7 F (37.1 C) (Oral)  Resp 16  SpO2 100% Physical Exam  Nursing note and vitals reviewed. Constitutional: He appears well-developed and well-nourished. No distress.  HENT:  Head: Normocephalic and atraumatic.  Eyes: Pupils are equal, round, and reactive to light.  Neck: Normal range of motion. Neck supple.  Cardiovascular: Normal rate and regular rhythm.   Pulmonary/Chest: Effort normal.  Abdominal: Soft. Bowel sounds are normal. He exhibits distension. He exhibits no ascites. There is no tenderness. There is no rigidity, no rebound, no guarding and no CVA tenderness.  No epigastric pain  Musculoskeletal:  No lower extremity swelling  Neurological: He is alert.  Skin: Skin is warm and dry.    ED Course  Procedures (including critical care time) Labs Review Labs Reviewed  BASIC METABOLIC PANEL - Abnormal; Notable for the following:    Sodium 136 (*)    Chloride 95 (*)    Glucose, Bld 263 (*)    All other components within normal limits  CBC  PRO B NATRIURETIC PEPTIDE  I-STAT TROPOININ, ED    Imaging Review Dg Chest 2 View  11/27/2013   CLINICAL DATA:  CHEST PAIN  EXAM: CHEST  2 VIEW  COMPARISON:  Two view chest 10/30/2006  FINDINGS: Stable cardiac silhouette and mediastinal contours. Lungs are clear. No acute osseous abnormalities.  IMPRESSION: Cardiac  silhouette a post normal to mildly enlarged, stable. No acute cardiopulmonary disease.   Electronically Signed   By: Margaree Mackintosh M.D.   On: 11/27/2013 12:41   Dg Abd 2 Views  11/27/2013   CLINICAL DATA:  Upper abdominal pain for 1 month, blowing nausea and constipation  EXAM: ABDOMEN - 2 VIEW  COMPARISON:  None.  FINDINGS: There are no abnormally dilated loops of bowel. There is mild fecal retention in the descending and proximal transverse colon.  IMPRESSION: Nonobstructive gas pattern with mild fecal retention.   Electronically Signed   By: Skipper Cliche M.D.   On: 11/27/2013 13:35     EKG Interpretation   Date/Time:  Saturday November 27 2013 11:38:21 EDT Ventricular Rate:  72 PR Interval:  234 QRS Duration:  106 QT Interval:  388 QTC Calculation: 424 R Axis:   8 Text Interpretation:  Sinus rhythm with 1st degree A-V block Inferior  infarct , age undetermined Anteroseptal infarct , age undetermined  Abnormal ECG Confirmed by DELOS  MD, DOUGLAS ([id number]) on 11/27/2013 1:31:21  PM      MDM   Final diagnoses:  Constipation, unspecified constipation type  Gastroesophageal reflux disease without esophagitis    The patient's chest x-ray, EKG, troponin are reassuring and since the symptoms have been constant for the past month in the setting of constipation and abdominal distention did not feel that this is related to ACS. An abdominal x-ray was ordered and showed that the patient does have some stool retention. I ordered Protonix and Carafate to protect the stomach. Will give patient prescription for enema and if that does not work he can use GoLYTELY.  53 y.o.Miguel Bell's evaluation in the Emergency Department is complete. It has been determined that no acute conditions requiring further emergency intervention are present at this time. The patient/guardian have been advised of the diagnosis and plan. We have discussed signs and symptoms that warrant return to the ED, such as changes  or worsening in symptoms.  Vital signs are stable at discharge. Filed Vitals:   11/27/13 1351  BP: 107/76  Pulse: 66  Temp:   Resp:     Patient/guardian has voiced understanding and agreed to follow-up with the PCP or specialist.     Linus Mako, PA-C 11/28/13 1534

## 2013-11-27 NOTE — ED Notes (Signed)
Pt comfortable with discharge and follow up instructions. Pt declines wheelchair, escorted to waiting area by this RN. Prescriptions x3. 

## 2013-11-30 NOTE — ED Provider Notes (Signed)
Medical screening examination/treatment/procedure(s) were performed by non-physician practitioner and as supervising physician I was immediately available for consultation/collaboration.   EKG Interpretation   Date/Time:  Saturday November 27 2013 11:38:21 EDT Ventricular Rate:  72 PR Interval:  234 QRS Duration: 106 QT Interval:  388 QTC Calculation: 424 R Axis:   8 Text Interpretation:  Sinus rhythm with 1st degree A-V block Inferior  infarct , age undetermined Anteroseptal infarct , age undetermined  Abnormal ECG Confirmed by Beau Fanny  MD, DOUGLAS ([id number]) on 11/27/2013 1:31:21  PM       Veryl Speak, MD 11/30/13 2338

## 2014-02-08 DEATH — deceased

## 2014-03-17 ENCOUNTER — Ambulatory Visit: Payer: Self-pay | Admitting: Internal Medicine

## 2015-12-01 IMAGING — CR DG ABDOMEN 2V
2 series · 2 of 2 positions shown · non-contrast
Comparison: None.

CLINICAL DATA: Upper abdominal pain for 1 month, blowing nausea and
constipation

EXAM:
ABDOMEN - 2 VIEW

[w abdomen upright]
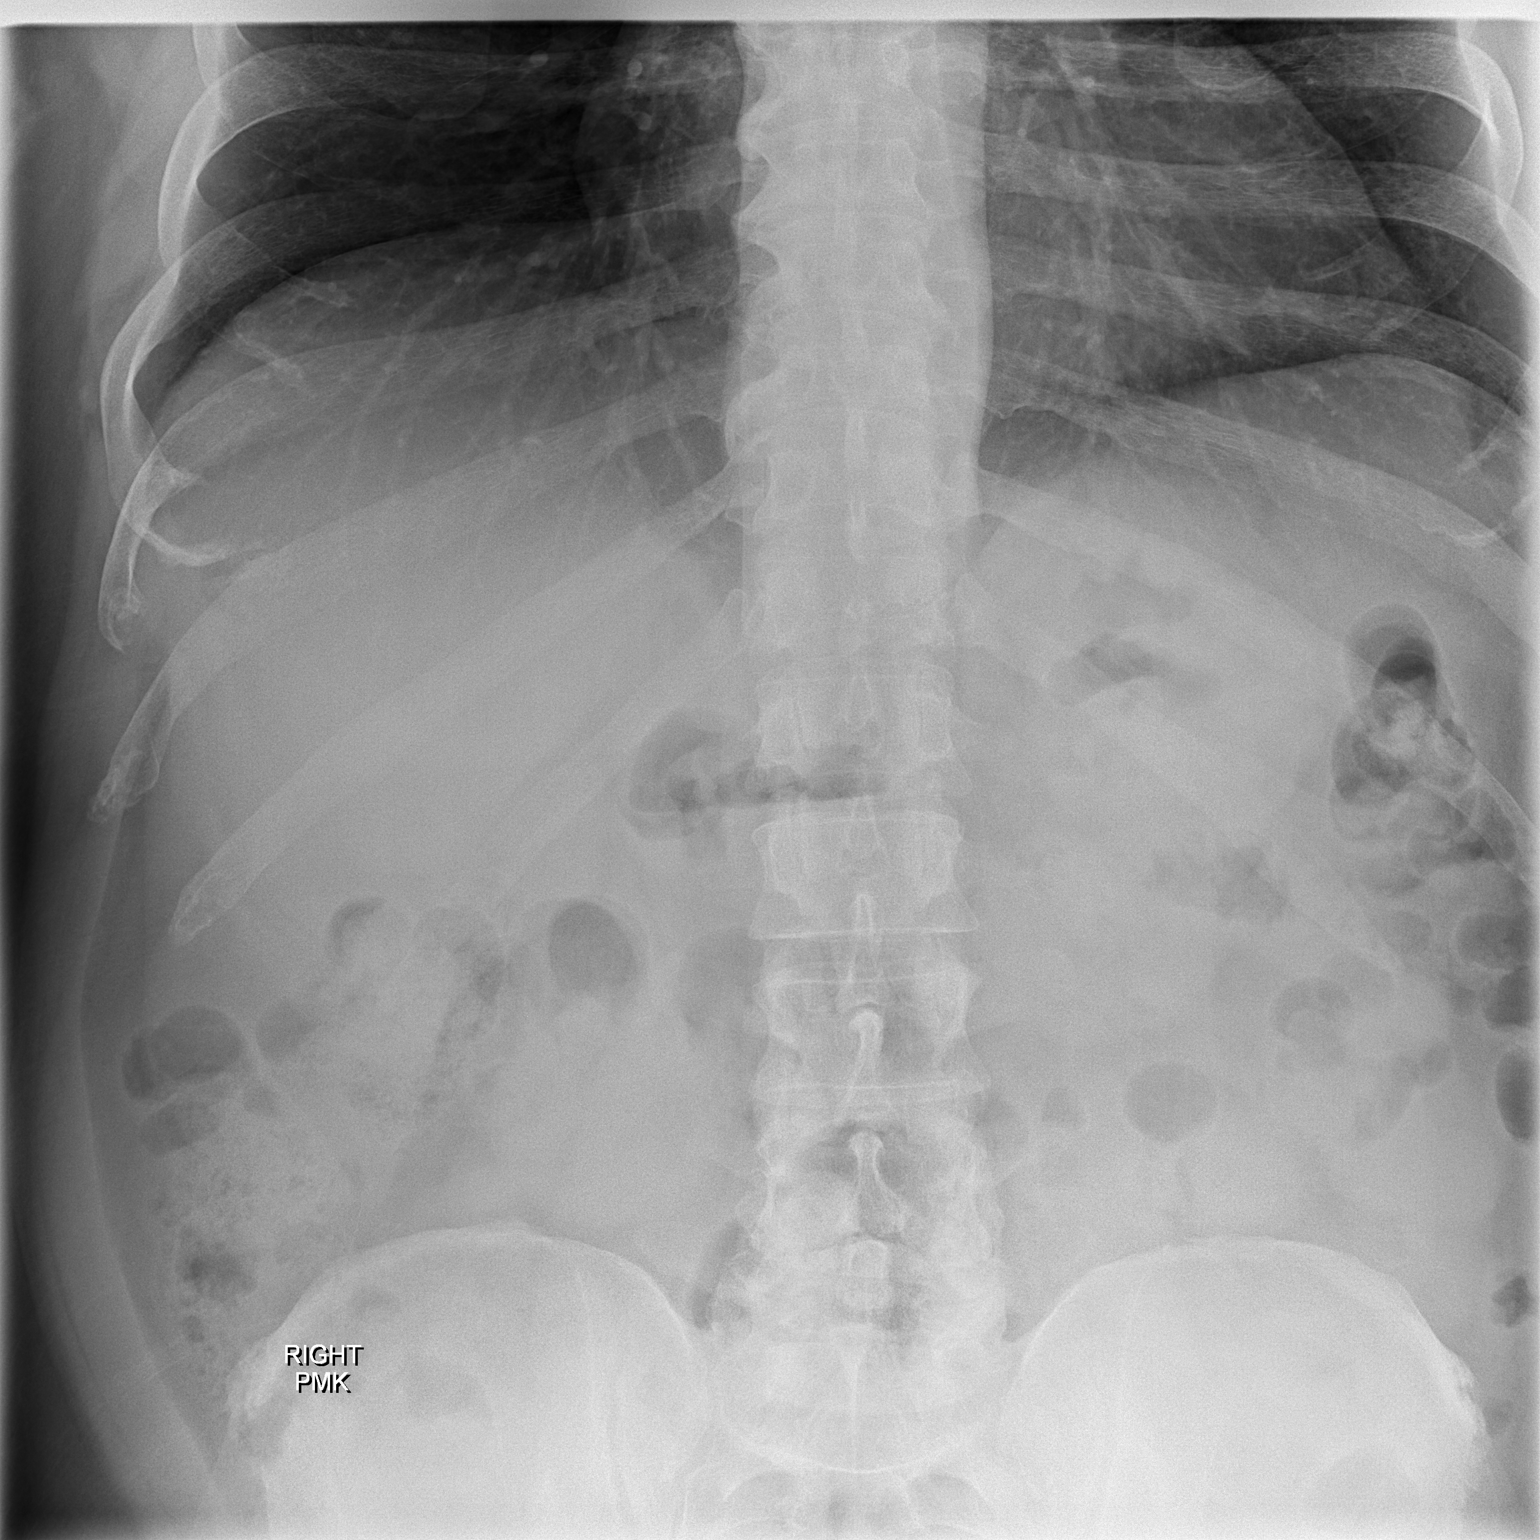

[t abdomen supine]
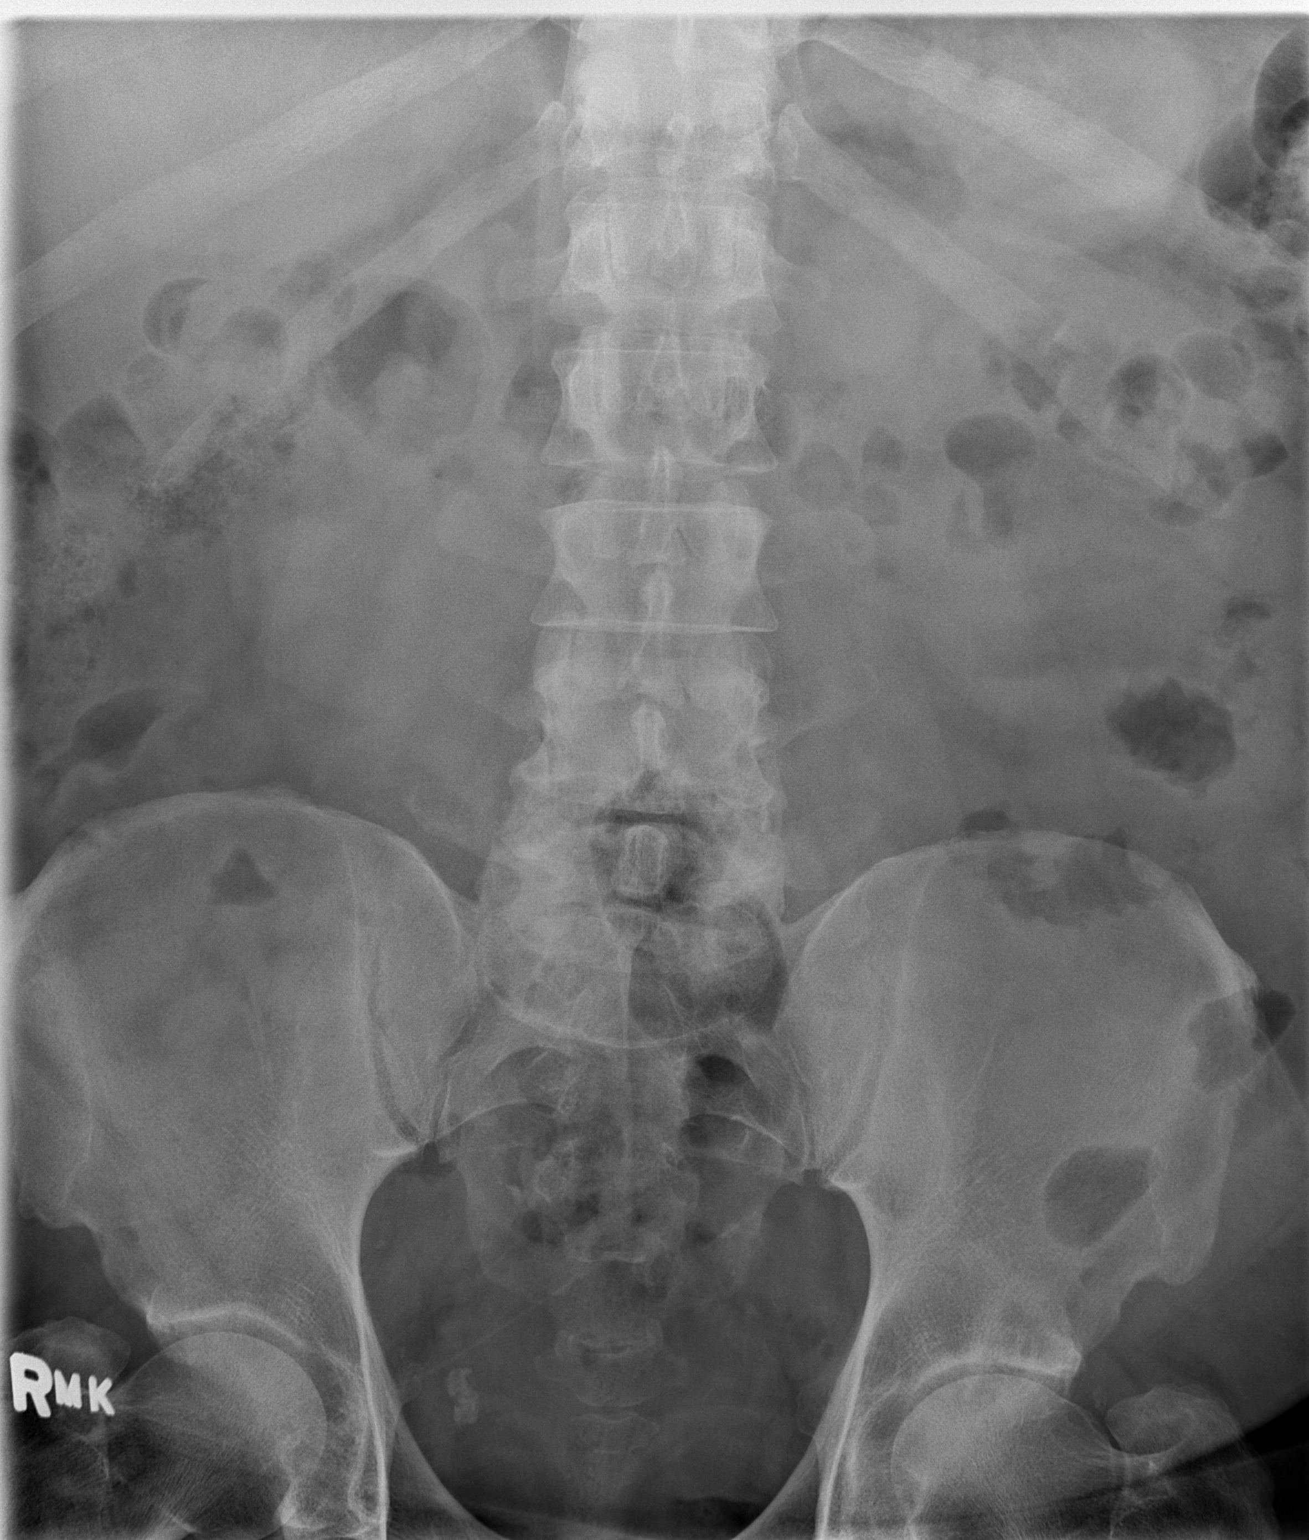

[2 of 2 positions shown; findings below may reference images not displayed]

FINDINGS: There are no abnormally dilated loops of bowel. There is mild fecal
retention in the descending and proximal transverse colon.
IMPRESSION: Nonobstructive gas pattern with mild fecal retention.
